# Patient Record
Sex: Female | Born: 1950 | Race: White | Hispanic: Yes | Smoking: Former smoker
Health system: Southern US, Community
[De-identification: ages and names within clinical notes are randomized; demographics above are authoritative.]

## PROBLEM LIST (undated history)

## (undated) DIAGNOSIS — J189 Pneumonia, unspecified organism: Secondary | ICD-10-CM

## (undated) DIAGNOSIS — F329 Major depressive disorder, single episode, unspecified: Secondary | ICD-10-CM

## (undated) DIAGNOSIS — M1990 Unspecified osteoarthritis, unspecified site: Secondary | ICD-10-CM

## (undated) DIAGNOSIS — F419 Anxiety disorder, unspecified: Secondary | ICD-10-CM

## (undated) DIAGNOSIS — F32A Depression, unspecified: Secondary | ICD-10-CM

## (undated) DIAGNOSIS — R51 Headache: Secondary | ICD-10-CM

## (undated) HISTORY — PX: CERVICAL FUSION: SHX112

---

## 2011-01-20 ENCOUNTER — Encounter (HOSPITAL_COMMUNITY)
Admission: RE | Admit: 2011-01-20 | Discharge: 2011-01-20 | Disposition: A | Discharge status: Home / Self Care | Payer: Medicare Other | Source: Ambulatory Visit | Attending: Neurosurgery | Admitting: Neurosurgery

## 2011-01-20 LAB — CBC
HCT: 37 % (ref 36.0–46.0)
Hemoglobin: 12.5 g/dL (ref 12.0–15.0)
MCH: 28.7 pg (ref 26.0–34.0)
MCHC: 33.8 g/dL (ref 30.0–36.0)
MCV: 85.1 fL (ref 78.0–100.0)
Platelets: 311 10*3/uL (ref 150–400)
RBC: 4.35 MIL/uL (ref 3.87–5.11)
RDW: 13.4 % (ref 11.5–15.5)
WBC: 8.8 10*3/uL (ref 4.0–10.5)

## 2011-01-20 LAB — COMPREHENSIVE METABOLIC PANEL
ALT: 24 U/L (ref 0–35)
AST: 27 U/L (ref 0–37)
Albumin: 3.5 g/dL (ref 3.5–5.2)
Alkaline Phosphatase: 76 U/L (ref 39–117)
BUN: 11 mg/dL (ref 6–23)
CO2: 28 mEq/L (ref 19–32)
Calcium: 9.2 mg/dL (ref 8.4–10.5)
Chloride: 99 mEq/L (ref 96–112)
Creatinine, Ser: 0.55 mg/dL (ref 0.50–1.10)
GFR calc Af Amer: >60 mL/min (ref >60)
GFR calc non Af Amer: >60 mL/min (ref >60)
Glucose, Bld: 86 mg/dL (ref 70–99)
Potassium: 4.2 mEq/L (ref 3.5–5.1)
Sodium: 137 mEq/L (ref 135–145)
Total Bilirubin: 0.2 mg/dL — ABNORMAL LOW (ref 0.3–1.2)
Total Protein: 7.6 g/dL (ref 6.0–8.3)

## 2011-01-20 LAB — SURGICAL PCR SCREEN
MRSA, PCR: NEGATIVE
Staphylococcus aureus: NEGATIVE

## 2011-01-24 ENCOUNTER — Inpatient Hospital Stay (HOSPITAL_COMMUNITY): Payer: Medicare Other

## 2011-01-24 ENCOUNTER — Inpatient Hospital Stay (HOSPITAL_COMMUNITY)
Admission: RE | Admit: 2011-01-24 | Discharge: 2011-01-26 | DRG: 473 | Disposition: A | Discharge status: Home / Self Care | Payer: Medicare Other | Source: Ambulatory Visit | Attending: Neurosurgery | Admitting: Neurosurgery

## 2011-01-24 DIAGNOSIS — R1310 Dysphagia, unspecified: Secondary | ICD-10-CM | POA: Diagnosis present

## 2011-01-24 DIAGNOSIS — F17200 Nicotine dependence, unspecified, uncomplicated: Secondary | ICD-10-CM | POA: Diagnosis present

## 2011-01-24 DIAGNOSIS — Z01812 Encounter for preprocedural laboratory examination: Secondary | ICD-10-CM

## 2011-01-24 DIAGNOSIS — M5000 Cervical disc disorder with myelopathy, unspecified cervical region: Principal | ICD-10-CM | POA: Diagnosis present

## 2011-01-31 NOTE — Op Note (Signed)
Tina [name], Tina [name]            ACCOUNT NO.:  [id number]  MEDICAL RECORD NO.:  [id number]  LOCATION:  3535                         FACILITY:  [facility]  PHYSICIAN:  Hewitt Shorts, M.D.DATE OF BIRTH:  [birth year]  DATE OF PROCEDURE:  01/24/2011 DATE OF DISCHARGE:                              OPERATIVE REPORT   PREOPERATIVE DIAGNOSES:  This is a cervical disk herniation with myelopathy, cervical spondylosis, with myelopathy, cervical stenosis and hyperreflexia.  POSTOPERATIVE DIAGNOSES:  This is a cervical disk herniation with myelopathy, cervical spondylosis with myelopathy, cervical stenosis and hyperreflexia.  PROCEDURE:  C3-4 and C4-5 anterior cervical decompression and arthrodesis with allograft and tether cervical plating.  SURGEON:  Hewitt Shorts, MD.  ASSISTANT:  Clydene Fake, MD.  ANESTHESIA:  General endotracheal.  INDICATIONS:  The patient is a 60 year old woman, who presented with progressive myelopathy.  She had had a fall in October 2011.  She had marked stenosis at the C3-4 level, moderate stenosis at C4-5 level, increased signal in the spinal cord from C3 to C4, moderate degenerative changes with left stenosis at C5-6 and C6-7.  A decision was made to proceed with decompression and stabilization at the C3-4 and C4-5 levels.  DESCRIPTION OF PROCEDURE:  The patient was brought to the operating room, placed on general endotracheal anesthesia.  The patient was placed in 10 pounds of Holter traction.  Neck was prepped with Betadine soap solution and draped in sterile fashion.  Horizontal incision was made on the left side of the neck.  The line of incision was infiltrated with local anesthetic with epinephrine.  Decision made, carried down through subcutaneous tissue and platysma.  Bipolar cautery was cautery used to maintain hemostasis.  Dissection was carried out through the avascular plane, leaving the sternocleidomastoid, carotid artery and  jugular vein laterally, and trachea and esophagus medially.  The ventral aspect of the tubal column identified and localized x-rays were taken, the C3-4, C4-5 intervertebral disk spaces were identified, visualized osteophyte anteriorly at C3-4.  This was removed and we entered into the disk space at both the C3-4 and C4-5 levels.  Diskectomy was performed using variety of micro curettes, pituitary rongeurs, with significant spondylotic degeneration at each level, worse at the C3-4 level.  The cartilaginous endplates were removed using micro curettes along the high- speed drill.  The operating microscope was draped and brought into the field to provide additional navigation, illumination, and visualization. The remainder of the dissection and decompression was performed using microdissection and microsurgical technique.  Posterior osteophytic overgrowth at each level was removed using the high-speed drill along with 2-mm Kerrison punch with thin footplate and then we carefully removed the spondylitic disk herniation in the posterior longitudinal ligament, decompressing the spinal canal and thecal sac at each level. We then continued our decompression laterally into the neural foramen, decompressing the neural foramen for the exiting C4 and C5 nerve roots.  Once decompression was complete, hemostasis established with use of Gelfoam soaked in thrombin.  We measured the height of the intervertebral disk space at each level and selected two 6-mm allograft implants, they were hydrated in saline solution and positioned in the intervertebral space and countersunk.  We then selected a  29-mm tether cervical plate was positioned over the fusion construct and secured to the vertebra with 4 x 13 mm screws placing a pair of variable screws at C3, a single variable screw at C4, and a pair of fixed screws at C5.  Each screw was drilled and the screws placed in the alternating fashion.  Once all five  screws were placed, the final tightening was performed.  An x-ray showed the graft, plate, and screws were all in good position.  The alignment was good.  Then, the wound was irrigated with Bacitracin solution, checked for hemostasis, which was established and confirmed, and then we proceed with closure.  The platysma was closed with interrupted inverted 2-0 Vicryl sutures.  Subcutaneous and subcuticular were closed with interrupted inverted 3-0 Vicryl sutures.  Skin was closed with Dermabond.  Procedure was tolerated well.  Estimated blood loss was 25 mL.  Sponge count correct.  Following surgery, the patient was placed in a soft cervical collar, reversed from the anesthetic, extubated, and transferred to the recovery room for further care.     Hewitt Shorts, M.D.     RWN/MEDQ  D:  01/24/2011  T:  01/25/2011  Job:  [id number]  Electronically Signed by Shirlean Kelly M.D. on 01/31/2011 09:47:42 AM

## 2011-09-01 ENCOUNTER — Encounter (HOSPITAL_COMMUNITY): Payer: Self-pay | Admitting: Pharmacy Technician

## 2011-09-05 ENCOUNTER — Inpatient Hospital Stay (HOSPITAL_COMMUNITY): Admission: RE | Admit: 2011-09-05 | Payer: Medicare Other | Source: Ambulatory Visit

## 2011-09-07 ENCOUNTER — Encounter (HOSPITAL_COMMUNITY): Payer: Self-pay

## 2011-09-07 ENCOUNTER — Encounter (HOSPITAL_COMMUNITY)
Admission: RE | Admit: 2011-09-07 | Discharge: 2011-09-07 | Disposition: A | Discharge status: Home / Self Care | Payer: Medicare Other | Source: Ambulatory Visit | Attending: Orthopedic Surgery | Admitting: Orthopedic Surgery

## 2011-09-07 HISTORY — DX: Depression, unspecified: F32.A

## 2011-09-07 HISTORY — DX: Anxiety disorder, unspecified: F41.9

## 2011-09-07 HISTORY — DX: Headache: R51

## 2011-09-07 HISTORY — DX: Unspecified osteoarthritis, unspecified site: M19.90

## 2011-09-07 HISTORY — DX: Major depressive disorder, single episode, unspecified: F32.9

## 2011-09-07 LAB — BASIC METABOLIC PANEL
CO2: 27 mEq/L (ref 19–32)
Calcium: 9.9 mg/dL (ref 8.4–10.5)
Chloride: 101 mEq/L (ref 96–112)
Sodium: 137 mEq/L (ref 135–145)

## 2011-09-07 LAB — URINALYSIS, ROUTINE W REFLEX MICROSCOPIC
Hgb urine dipstick: NEGATIVE
Nitrite: NEGATIVE
Specific Gravity, Urine: 1.025 (ref 1.005–1.030)
Urobilinogen, UA: 0.2 mg/dL (ref 0.0–1.0)

## 2011-09-07 LAB — DIFFERENTIAL
Eosinophils Relative: 11 % — ABNORMAL HIGH (ref 0–5)
Lymphocytes Relative: 31 % (ref 12–46)
Lymphs Abs: 3.1 10*3/uL (ref 0.7–4.0)
Neutro Abs: 5.3 10*3/uL (ref 1.7–7.7)

## 2011-09-07 LAB — CBC
HCT: 40.8 % (ref 36.0–46.0)
MCV: 88.3 fL (ref 78.0–100.0)
Platelets: 322 10*3/uL (ref 150–400)
RBC: 4.62 MIL/uL (ref 3.87–5.11)
WBC: 10.1 10*3/uL (ref 4.0–10.5)

## 2011-09-07 LAB — SURGICAL PCR SCREEN
MRSA, PCR: NEGATIVE
Staphylococcus aureus: NEGATIVE

## 2011-09-07 LAB — APTT: aPTT: 43 seconds — ABNORMAL HIGH (ref 24–37)

## 2011-09-07 LAB — URINE MICROSCOPIC-ADD ON

## 2011-09-07 LAB — PROTIME-INR: Prothrombin Time: 13.3 seconds (ref 11.6–15.2)

## 2011-09-07 NOTE — Patient Instructions (Signed)
20 NATURE KUEKER  09/07/2011   Your procedure is scheduled on: 09/13/11 0835am-0940am  Report to [facility] at 0600 AM.  Call this number if you have problems the morning of surgery: [phone]   Remember:   Do not eat food:After Midnight.    Take these medicines the morning of surgery with A SIP OF WATER:    Do not wear jewelry, make-up or nail polish.  Do not wear lotions, powders, or perfumes..  Do not shave 48 hours prior to surgery.  Do not bring valuables to the hospital.  Contacts, dentures or bridgework may not be worn into surgery.  Leave suitcase in the car. After surgery it may be brought to your room.  For patients admitted to the hospital, checkout time is 11:00 AM the day of discharge.      Special Instructions: CHG Shower Use Special Wash: 1/2 bottle night before surgery and 1/2 bottle morning of surgery. shower chin to toes with CHG.  Wash face adn private parts with regular soap.    Please read over the following fact sheets that you were given: MRSA Informationm coughing and deep breathing exercises, leg exercises, Blood Transfusion Fact Sheet, Incentive Spirometry Fact Sheet

## 2011-09-07 NOTE — Pre-Procedure Instructions (Signed)
09/07/11 Left message on voice mail of Tina [name] abnormal Urinalysis result along with PTT result of 43.

## 2011-09-07 NOTE — Progress Notes (Signed)
H&P performed 09/07/2011 Dictation # [id number]

## 2011-09-08 NOTE — H&P (Signed)
Tina, [name]            ACCOUNT NO.:  [id number]  MEDICAL RECORD NO.:  [id number]  LOCATION:  PADM                         FACILITY:  [facility]  PHYSICIAN:  Madlyn Frankel. Charlann Boxer, M.D.  DATE OF BIRTH:  1951-07-10  DATE OF ADMISSION:  09/07/2011 DATE OF DISCHARGE:  09/07/2011                             HISTORY & PHYSICAL   ANTICIPATED DATE OF SURGERY:  September 13, 2011.  ADMITTING DIAGNOSIS:  Osteoarthritis, right hip.  HISTORY OF PRESENT ILLNESS:  This is a 61 year old lady with a history of osteoarthritis of right hip that has failed conservative management. After discussion of treatments, benefits, risks, and options, the patient is now scheduled for total hip arthroplasty of the right hip by anterior approach.  Note that, the patient is a candidate for tranexamic acid  and dexamethasone, which she will receive both at surgery.  Her medical doctor is Dr. Marylouise [name]  at [facility].  She does plan on going home after surgery and she is given her home medications of iron, Robaxin, MiraLax, Colace, and aspirin.  DRUG ALLERGIES:  None.  CURRENT MEDICATIONS:  Zoloft, aspirin, Klonopin, Cymbalta, and trazodone.  She did not bring her dosages with her.  She will bring those to the hospital.  PREVIOUS SURGERIES:  Anterior cervical fusion.  SERIOUS MEDICAL ILLNESSES:  Anxiety, depression, vertigo, and arthritis.  FAMILY HISTORY:  Positive for diabetes.  SOCIAL HISTORY:  The patient is divorced.  She is unemployed, on disability.  She smokes about a half a pack a day and drinks occasionally.  REVIEW OF SYSTEMS:  CENTRAL NERVOUS SYSTEM:  Positive for vertigo and anxiety.  PULMONARY:  Negative for shortness of breath, PND, and orthopnea.  CARDIOVASCULAR:  Negative for chest pain, palpitation.  GI: Negative for ulcers, hepatitis.  GU:  Negative for urinary tract difficulty.  MUSCULOSKELETAL:  Positive in the HPI.  PHYSICAL EXAMINATION:  VITAL SIGNS:  Blood  pressure 115/75, pulse 78 and regular, respirations 14. HEENT:  Head, normocephalic.  Nose patent.  Ears patent.  Pupils equal, round, reactive to light.  Throat without injection. NECK:  Supple without adenopathy.  Carotids 2+ without bruits. CHEST:  Clear to auscultation.  No rales or rhonchi.  Respirations 14. HEART:  Regular rate and rhythm at 78 beats per minute without murmur. ABDOMEN:  Soft with active bowel sounds.  No masses or organomegaly. NEUROLOGIC:  The patient is alert, oriented to time, place, and person. Cranial nerves II through XII grossly intact. EXTREMITIES:  Right hip with decreased range of motion with pain. Sensation and circulation are intact.  Dorsalis pedis and posterior tibialis pulses are 2+.  IMPRESSION:  Osteoarthritis, right hip.  PLAN:  Total hip arthroplasty, right hip, by anterior approach.     Jaquelyn Bitter. Laraine Samet, P.A.   ______________________________ Madlyn Frankel Charlann Boxer, M.D.    SJC/MEDQ  D:  09/07/2011  T:  09/08/2011  Job:  [id number]

## 2011-09-08 NOTE — H&P (Signed)
Tina [name], PROVENCIO            ACCOUNT NO.:  [id number]  MEDICAL RECORD NO.:  [id number]  LOCATION:  PADM                         FACILITY:  [facility]  PHYSICIAN:  Jaquelyn Bitter. Ellenor Wisniewski, P.A.DATE OF BIRTH:  1951-06-24  DATE OF ADMISSION:  09/07/2011 DATE OF DISCHARGE:  09/07/2011                             HISTORY & PHYSICAL   ADDENDUM:  Also note that she is given her home medicines of aspirin, Robaxin, iron, MiraLax, and Colace to take postoperatively.     Jaquelyn Bitter. Ernestene Kiel.     SJC/MEDQ  D:  09/07/2011  T:  09/08/2011  Job:  [id number]

## 2011-09-13 ENCOUNTER — Inpatient Hospital Stay (HOSPITAL_COMMUNITY): Payer: Medicare Other

## 2011-09-13 ENCOUNTER — Encounter (HOSPITAL_COMMUNITY): Payer: Self-pay | Admitting: Anesthesiology

## 2011-09-13 ENCOUNTER — Encounter (HOSPITAL_COMMUNITY): Payer: Self-pay | Admitting: *Deleted

## 2011-09-13 ENCOUNTER — Encounter (HOSPITAL_COMMUNITY)
Admission: RE | Disposition: A | Discharge status: Skilled Nursing Facility | Payer: Self-pay | Source: Ambulatory Visit | Attending: Orthopedic Surgery

## 2011-09-13 ENCOUNTER — Inpatient Hospital Stay (HOSPITAL_COMMUNITY)
Admission: RE | Admit: 2011-09-13 | Discharge: 2011-09-16 | DRG: 470 | Disposition: A | Discharge status: Skilled Nursing Facility | Payer: Medicare Other | Source: Ambulatory Visit | Attending: Orthopedic Surgery | Admitting: Orthopedic Surgery

## 2011-09-13 ENCOUNTER — Inpatient Hospital Stay (HOSPITAL_COMMUNITY): Payer: Medicare Other | Admitting: Anesthesiology

## 2011-09-13 DIAGNOSIS — Z01812 Encounter for preprocedural laboratory examination: Secondary | ICD-10-CM

## 2011-09-13 DIAGNOSIS — Z981 Arthrodesis status: Secondary | ICD-10-CM

## 2011-09-13 DIAGNOSIS — D649 Anemia, unspecified: Secondary | ICD-10-CM | POA: Diagnosis not present

## 2011-09-13 DIAGNOSIS — F329 Major depressive disorder, single episode, unspecified: Secondary | ICD-10-CM | POA: Diagnosis present

## 2011-09-13 DIAGNOSIS — Z96649 Presence of unspecified artificial hip joint: Secondary | ICD-10-CM

## 2011-09-13 DIAGNOSIS — M169 Osteoarthritis of hip, unspecified: Principal | ICD-10-CM | POA: Diagnosis present

## 2011-09-13 DIAGNOSIS — M1610 Unilateral primary osteoarthritis, unspecified hip: Principal | ICD-10-CM | POA: Diagnosis present

## 2011-09-13 DIAGNOSIS — R42 Dizziness and giddiness: Secondary | ICD-10-CM | POA: Diagnosis not present

## 2011-09-13 DIAGNOSIS — F411 Generalized anxiety disorder: Secondary | ICD-10-CM | POA: Diagnosis present

## 2011-09-13 DIAGNOSIS — F17200 Nicotine dependence, unspecified, uncomplicated: Secondary | ICD-10-CM | POA: Diagnosis present

## 2011-09-13 DIAGNOSIS — F3289 Other specified depressive episodes: Secondary | ICD-10-CM | POA: Diagnosis present

## 2011-09-13 HISTORY — PX: TOTAL HIP ARTHROPLASTY: SHX124

## 2011-09-13 LAB — TYPE AND SCREEN: Antibody Screen: NEGATIVE

## 2011-09-13 LAB — ABO/RH: ABO/RH(D): A POS

## 2011-09-13 SURGERY — ARTHROPLASTY, HIP, TOTAL, ANTERIOR APPROACH
Anesthesia: General | Site: Hip | Laterality: Right | Wound class: Clean

## 2011-09-13 MED ORDER — METHOCARBAMOL 500 MG PO TABS
500.0000 mg | ORAL_TABLET | Freq: Four times a day (QID) | ORAL | Status: DC | PRN
  Administered 2011-09-14 – 2011-09-16 (×4): 500 mg via ORAL
  Filled 2011-09-13 (×4): qty 1

## 2011-09-13 MED ORDER — ACETAMINOPHEN 650 MG RE SUPP: 650.0000 mg | Freq: Four times a day (QID) | RECTAL | Status: DC | PRN

## 2011-09-13 MED ORDER — PHENOL 1.4 % MT LIQD
1.0000 | OROMUCOSAL | Status: DC | PRN
  Filled 2011-09-13: qty 177

## 2011-09-13 MED ORDER — MIDAZOLAM HCL 5 MG/5ML IJ SOLN
INTRAMUSCULAR | Status: DC | PRN
  Administered 2011-09-13: 2 mg via INTRAVENOUS

## 2011-09-13 MED ORDER — FERROUS SULFATE 325 (65 FE) MG PO TABS
325.0000 mg | ORAL_TABLET | Freq: Three times a day (TID) | ORAL | Status: DC
  Administered 2011-09-13 – 2011-09-16 (×8): 325 mg via ORAL
  Filled 2011-09-13 (×6): qty 1

## 2011-09-13 MED ORDER — METHOCARBAMOL 100 MG/ML IJ SOLN
500.0000 mg | Freq: Four times a day (QID) | INTRAVENOUS | Status: DC | PRN
  Administered 2011-09-13: 500 mg via INTRAVENOUS
  Filled 2011-09-13 (×2): qty 5

## 2011-09-13 MED ORDER — MENTHOL 3 MG MT LOZG
1.0000 | LOZENGE | OROMUCOSAL | Status: DC | PRN
  Filled 2011-09-13: qty 9

## 2011-09-13 MED ORDER — HYDROCODONE-ACETAMINOPHEN 7.5-325 MG PO TABS
1.0000 | ORAL_TABLET | ORAL | Status: DC
  Administered 2011-09-13: 2 via ORAL
  Administered 2011-09-13 – 2011-09-14 (×2): 1 via ORAL
  Administered 2011-09-14 (×2): 2 via ORAL
  Administered 2011-09-14: 1 via ORAL
  Administered 2011-09-14 – 2011-09-16 (×9): 2 via ORAL
  Filled 2011-09-13 (×11): qty 2
  Filled 2011-09-13: qty 1
  Filled 2011-09-13 (×3): qty 2

## 2011-09-13 MED ORDER — CEFAZOLIN SODIUM 1-5 GM-% IV SOLN
1.0000 g | Freq: Four times a day (QID) | INTRAVENOUS | Status: AC
  Administered 2011-09-13 – 2011-09-14 (×3): 1 g via INTRAVENOUS
  Filled 2011-09-13 (×3): qty 50

## 2011-09-13 MED ORDER — POLYETHYLENE GLYCOL 3350 17 G PO PACK
17.0000 g | PACK | Freq: Two times a day (BID) | ORAL | Status: DC
  Administered 2011-09-14 – 2011-09-16 (×4): 17 g via ORAL
  Filled 2011-09-13 (×5): qty 1

## 2011-09-13 MED ORDER — ONDANSETRON HCL 4 MG/2ML IJ SOLN
INTRAMUSCULAR | Status: DC | PRN
  Administered 2011-09-13: 4 mg via INTRAVENOUS

## 2011-09-13 MED ORDER — HYDROMORPHONE HCL PF 1 MG/ML IJ SOLN
0.5000 mg | INTRAMUSCULAR | Status: DC | PRN
  Administered 2011-09-14 (×2): 1 mg via INTRAVENOUS
  Filled 2011-09-13 (×2): qty 1

## 2011-09-13 MED ORDER — CEFAZOLIN SODIUM 1-5 GM-% IV SOLN
1.0000 g | INTRAVENOUS | Status: AC
  Administered 2011-09-13: 1 g via INTRAVENOUS

## 2011-09-13 MED ORDER — FENTANYL CITRATE 0.05 MG/ML IJ SOLN
INTRAMUSCULAR | Status: DC | PRN
  Administered 2011-09-13: 50 ug via INTRAVENOUS
  Administered 2011-09-13: 100 ug via INTRAVENOUS
  Administered 2011-09-13: 25 ug via INTRAVENOUS
  Administered 2011-09-13: 50 ug via INTRAVENOUS
  Administered 2011-09-13 (×2): 25 ug via INTRAVENOUS
  Administered 2011-09-13: 50 ug via INTRAVENOUS
  Administered 2011-09-13 (×2): 25 ug via INTRAVENOUS
  Administered 2011-09-13: 50 ug via INTRAVENOUS
  Administered 2011-09-13: 25 ug via INTRAVENOUS
  Administered 2011-09-13: 50 ug via INTRAVENOUS

## 2011-09-13 MED ORDER — DEXAMETHASONE SODIUM PHOSPHATE 10 MG/ML IJ SOLN
INTRAMUSCULAR | Status: DC | PRN
  Administered 2011-09-13: 10 mg via INTRAVENOUS

## 2011-09-13 MED ORDER — ACETAMINOPHEN 325 MG PO TABS: 650.0000 mg | ORAL_TABLET | Freq: Four times a day (QID) | ORAL | Status: DC | PRN

## 2011-09-13 MED ORDER — METOCLOPRAMIDE HCL 5 MG/ML IJ SOLN: 5.0000 mg | Freq: Three times a day (TID) | INTRAMUSCULAR | Status: DC | PRN

## 2011-09-13 MED ORDER — 0.9 % SODIUM CHLORIDE (POUR BTL) OPTIME
TOPICAL | Status: DC | PRN
  Administered 2011-09-13: 1000 mL

## 2011-09-13 MED ORDER — ACETAMINOPHEN 10 MG/ML IV SOLN
INTRAVENOUS | Status: DC | PRN
  Administered 2011-09-13: 1000 mg via INTRAVENOUS

## 2011-09-13 MED ORDER — ONDANSETRON HCL 4 MG/2ML IJ SOLN: 4.0000 mg | Freq: Four times a day (QID) | INTRAMUSCULAR | Status: DC | PRN

## 2011-09-13 MED ORDER — HYDROMORPHONE HCL PF 1 MG/ML IJ SOLN
INTRAMUSCULAR | Status: AC
  Filled 2011-09-13: qty 1

## 2011-09-13 MED ORDER — NEOSTIGMINE METHYLSULFATE 1 MG/ML IJ SOLN
INTRAMUSCULAR | Status: DC | PRN
  Administered 2011-09-13: 4 mg via INTRAVENOUS

## 2011-09-13 MED ORDER — LIDOCAINE HCL (CARDIAC) 20 MG/ML IV SOLN
INTRAVENOUS | Status: DC | PRN
  Administered 2011-09-13: 60 mg via INTRAVENOUS

## 2011-09-13 MED ORDER — DEXAMETHASONE SODIUM PHOSPHATE 10 MG/ML IJ SOLN
10.0000 mg | Freq: Once | INTRAMUSCULAR | Status: AC
  Administered 2011-09-14: 10 mg via INTRAVENOUS
  Filled 2011-09-13: qty 1

## 2011-09-13 MED ORDER — HYDRALAZINE HCL 20 MG/ML IJ SOLN
INTRAMUSCULAR | Status: DC | PRN
  Administered 2011-09-13 (×2): 2 mg via INTRAVENOUS

## 2011-09-13 MED ORDER — KETAMINE HCL 10 MG/ML IJ SOLN
INTRAMUSCULAR | Status: DC | PRN
  Administered 2011-09-13: 5 mg via INTRAVENOUS
  Administered 2011-09-13 (×2): 10 mg via INTRAVENOUS

## 2011-09-13 MED ORDER — GLYCOPYRROLATE 0.2 MG/ML IJ SOLN
INTRAMUSCULAR | Status: DC | PRN
  Administered 2011-09-13: .6 mg via INTRAVENOUS

## 2011-09-13 MED ORDER — ALUM & MAG HYDROXIDE-SIMETH 200-200-20 MG/5ML PO SUSP: 30.0000 mL | ORAL | Status: DC | PRN

## 2011-09-13 MED ORDER — PROPOFOL 10 MG/ML IV EMUL
INTRAVENOUS | Status: DC | PRN
  Administered 2011-09-13: 150 mg via INTRAVENOUS

## 2011-09-13 MED ORDER — FLEET ENEMA 7-19 GM/118ML RE ENEM: 1.0000 | ENEMA | Freq: Once | RECTAL | Status: AC | PRN

## 2011-09-13 MED ORDER — RIVAROXABAN 10 MG PO TABS
10.0000 mg | ORAL_TABLET | ORAL | Status: DC
  Administered 2011-09-14 – 2011-09-16 (×3): 10 mg via ORAL
  Filled 2011-09-13 (×3): qty 1

## 2011-09-13 MED ORDER — CLONAZEPAM 1 MG PO TABS
1.0000 mg | ORAL_TABLET | Freq: Three times a day (TID) | ORAL | Status: DC
  Administered 2011-09-13 – 2011-09-16 (×8): 1 mg via ORAL
  Filled 2011-09-13 (×8): qty 1

## 2011-09-13 MED ORDER — HETASTARCH-ELECTROLYTES 6 % IV SOLN
INTRAVENOUS | Status: DC | PRN
  Administered 2011-09-13: 16:00:00 via INTRAVENOUS

## 2011-09-13 MED ORDER — DIPHENHYDRAMINE HCL 25 MG PO CAPS: 25.0000 mg | ORAL_CAPSULE | Freq: Four times a day (QID) | ORAL | Status: DC | PRN

## 2011-09-13 MED ORDER — TRAZODONE HCL 50 MG PO TABS
50.0000 mg | ORAL_TABLET | Freq: Every day | ORAL | Status: DC
  Administered 2011-09-13 – 2011-09-15 (×3): 100 mg via ORAL
  Filled 2011-09-13 (×4): qty 2

## 2011-09-13 MED ORDER — SERTRALINE HCL 100 MG PO TABS
200.0000 mg | ORAL_TABLET | Freq: Every day | ORAL | Status: DC
  Administered 2011-09-14 – 2011-09-16 (×3): 200 mg via ORAL
  Filled 2011-09-13 (×3): qty 2

## 2011-09-13 MED ORDER — DOCUSATE SODIUM 100 MG PO CAPS
100.0000 mg | ORAL_CAPSULE | Freq: Two times a day (BID) | ORAL | Status: DC
  Administered 2011-09-13 – 2011-09-16 (×5): 100 mg via ORAL
  Filled 2011-09-13 (×4): qty 1

## 2011-09-13 MED ORDER — SODIUM CHLORIDE 0.9 % IV SOLN
100.0000 mL/h | INTRAVENOUS | Status: DC
  Administered 2011-09-13 – 2011-09-14 (×3): 100 mL/h via INTRAVENOUS
  Filled 2011-09-13 (×11): qty 1000

## 2011-09-13 MED ORDER — LACTATED RINGERS IV SOLN
INTRAVENOUS | Status: DC
  Administered 2011-09-13 (×2): via INTRAVENOUS
  Administered 2011-09-13: 1000 mL via INTRAVENOUS

## 2011-09-13 MED ORDER — HYDROMORPHONE HCL PF 1 MG/ML IJ SOLN
INTRAMUSCULAR | Status: AC
  Administered 2011-09-13 (×2): 0.5 mg via INTRAVENOUS
  Filled 2011-09-13: qty 1

## 2011-09-13 MED ORDER — BISACODYL 5 MG PO TBEC: 5.0000 mg | DELAYED_RELEASE_TABLET | Freq: Every day | ORAL | Status: DC | PRN

## 2011-09-13 MED ORDER — HYDROMORPHONE HCL PF 1 MG/ML IJ SOLN
0.2500 mg | INTRAMUSCULAR | Status: DC | PRN
  Administered 2011-09-13 (×3): 0.5 mg via INTRAVENOUS

## 2011-09-13 MED ORDER — ONDANSETRON HCL 4 MG PO TABS: 4.0000 mg | ORAL_TABLET | Freq: Four times a day (QID) | ORAL | Status: DC | PRN

## 2011-09-13 MED ORDER — EPHEDRINE SULFATE 50 MG/ML IJ SOLN
INTRAMUSCULAR | Status: DC | PRN
  Administered 2011-09-13 (×2): 5 mg via INTRAVENOUS

## 2011-09-13 MED ORDER — DEXAMETHASONE SODIUM PHOSPHATE 10 MG/ML IJ SOLN
10.0000 mg | Freq: Once | INTRAMUSCULAR | Status: DC
  Filled 2011-09-13: qty 1

## 2011-09-13 MED ORDER — DULOXETINE HCL 60 MG PO CPEP
120.0000 mg | ORAL_CAPSULE | Freq: Every day | ORAL | Status: DC
  Administered 2011-09-13 – 2011-09-16 (×4): 120 mg via ORAL
  Filled 2011-09-13 (×4): qty 2

## 2011-09-13 MED ORDER — LACTATED RINGERS IV SOLN
INTRAVENOUS | Status: DC
Start: 2011-09-13 — End: 2011-09-13

## 2011-09-13 MED ORDER — METOCLOPRAMIDE HCL 10 MG PO TABS: 5.0000 mg | ORAL_TABLET | Freq: Three times a day (TID) | ORAL | Status: DC | PRN

## 2011-09-13 MED ORDER — ZOLPIDEM TARTRATE 5 MG PO TABS: 5.0000 mg | ORAL_TABLET | Freq: Every evening | ORAL | Status: DC | PRN

## 2011-09-13 MED ORDER — ROCURONIUM BROMIDE 100 MG/10ML IV SOLN
INTRAVENOUS | Status: DC | PRN
  Administered 2011-09-13: 40 mg via INTRAVENOUS
  Administered 2011-09-13 (×2): 10 mg via INTRAVENOUS

## 2011-09-13 MED ORDER — SODIUM CHLORIDE 0.9 % IV SOLN
1120.0000 mg | Freq: Once | INTRAVENOUS | Status: AC
  Administered 2011-09-13: 1120 mg via INTRAVENOUS
  Filled 2011-09-13: qty 11.2

## 2011-09-13 MED ORDER — ALBUTEROL SULFATE HFA 108 (90 BASE) MCG/ACT IN AERS
INHALATION_SPRAY | RESPIRATORY_TRACT | Status: DC | PRN
  Administered 2011-09-13: 5 via RESPIRATORY_TRACT

## 2011-09-13 SURGICAL SUPPLY — 39 items
BAG ZIPLOCK 12X15 (MISCELLANEOUS) ×2 IMPLANT
BLADE SAW SGTL 18X1.27X75 (BLADE) ×2 IMPLANT
CELLS DAT CNTRL 66122 CELL SVR (MISCELLANEOUS) IMPLANT
CLOTH BEACON ORANGE TIMEOUT ST (SAFETY) ×2 IMPLANT
DERMABOND ADVANCED (GAUZE/BANDAGES/DRESSINGS) ×1
DERMABOND ADVANCED .7 DNX12 (GAUZE/BANDAGES/DRESSINGS) ×1 IMPLANT
DRAPE C-ARM 42X72 X-RAY (DRAPES) ×2 IMPLANT
DRAPE STERI IOBAN 125X83 (DRAPES) ×2 IMPLANT
DRAPE U-SHAPE 47X51 STRL (DRAPES) ×6 IMPLANT
DRSG AQUACEL AG ADV 3.5X10 (GAUZE/BANDAGES/DRESSINGS) ×2 IMPLANT
DRSG TEGADERM 4X4.75 (GAUZE/BANDAGES/DRESSINGS) ×2 IMPLANT
DURAPREP 26ML APPLICATOR (WOUND CARE) ×2 IMPLANT
ELECT BLADE TIP CTD 4 INCH (ELECTRODE) ×2 IMPLANT
ELECT REM PT RETURN 9FT ADLT (ELECTROSURGICAL) ×2
ELECTRODE REM PT RTRN 9FT ADLT (ELECTROSURGICAL) ×1 IMPLANT
EVACUATOR 1/8 PVC DRAIN (DRAIN) ×2 IMPLANT
FACESHIELD LNG OPTICON STERILE (SAFETY) ×8 IMPLANT
GAUZE SPONGE 2X2 8PLY STRL LF (GAUZE/BANDAGES/DRESSINGS) ×1 IMPLANT
GLOVE BIOGEL PI IND STRL 7.5 (GLOVE) ×1 IMPLANT
GLOVE BIOGEL PI IND STRL 8 (GLOVE) ×1 IMPLANT
GLOVE BIOGEL PI INDICATOR 7.5 (GLOVE) ×1
GLOVE BIOGEL PI INDICATOR 8 (GLOVE) ×1
GLOVE ECLIPSE 8.0 STRL XLNG CF (GLOVE) ×2 IMPLANT
GLOVE ORTHO TXT STRL SZ7.5 (GLOVE) ×4 IMPLANT
GOWN BRE IMP PREV XXLGXLNG (GOWN DISPOSABLE) ×2 IMPLANT
GOWN STRL NON-REIN LRG LVL3 (GOWN DISPOSABLE) ×2 IMPLANT
KIT BASIN OR (CUSTOM PROCEDURE TRAY) ×2 IMPLANT
PACK TOTAL JOINT (CUSTOM PROCEDURE TRAY) ×2 IMPLANT
PADDING CAST COTTON 6X4 STRL (CAST SUPPLIES) ×2 IMPLANT
RTRCTR WOUND ALEXIS 18CM MED (MISCELLANEOUS)
SPONGE GAUZE 2X2 STER 10/PKG (GAUZE/BANDAGES/DRESSINGS) ×1
SUCTION FRAZIER 12FR DISP (SUCTIONS) ×2 IMPLANT
SUT MNCRL AB 4-0 PS2 18 (SUTURE) ×2 IMPLANT
SUT VIC AB 1 CT1 36 (SUTURE) ×6 IMPLANT
SUT VIC AB 2-0 CT1 27 (SUTURE) ×2
SUT VIC AB 2-0 CT1 TAPERPNT 27 (SUTURE) ×2 IMPLANT
SUT VLOC 180 0 24IN GS25 (SUTURE) ×2 IMPLANT
TOWEL OR 17X26 10 PK STRL BLUE (TOWEL DISPOSABLE) ×4 IMPLANT
TRAY FOLEY CATH 14FRSI W/METER (CATHETERS) ×2 IMPLANT

## 2011-09-13 NOTE — Anesthesia Postprocedure Evaluation (Signed)
  Anesthesia Post-op Note  Patient: Tina [name]  Procedure(s) Performed: Procedure(s) (LRB): TOTAL HIP ARTHROPLASTY ANTERIOR APPROACH (Right)  Patient Location: PACU  Anesthesia Type: General  Level of Consciousness: awake and alert   Airway and Oxygen Therapy: Patient Spontanous Breathing  Post-op Pain: mild  Post-op Assessment: Post-op Vital signs reviewed, Patient's Cardiovascular Status Stable, Respiratory Function Stable, Patent Airway and No signs of Nausea or vomiting  Post-op Vital Signs: stable  Complications: No apparent anesthesia complications

## 2011-09-13 NOTE — H&P (View-Only) (Signed)
NAME:  [name], Tina            ACCOUNT NO.:  [id number]  MEDICAL RECORD NO.:  [id number]  LOCATION:  PADM                         FACILITY:  [facility]  PHYSICIAN:  Charnay Nazario J. Aissa Lisowski, P.A.DATE OF BIRTH:  06/07/1951  DATE OF ADMISSION:  09/07/2011 DATE OF DISCHARGE:  09/07/2011                             HISTORY & PHYSICAL   ADDENDUM:  Also note that she is given her home medicines of aspirin, Robaxin, iron, MiraLax, and Colace to take postoperatively.     Tearra Ouk J. Senta Kantor, P.A.     SJC/MEDQ  D:  09/07/2011  T:  09/08/2011  Job:  [id number] 

## 2011-09-13 NOTE — Plan of Care (Signed)
Problem: Consults Goal: Diagnosis- Total Joint Replacement Primary Total Hip     

## 2011-09-13 NOTE — Preoperative (Signed)
Beta Blockers   Reason not to administer Beta Blockers:Not Applicable 

## 2011-09-13 NOTE — Op Note (Signed)
NAME:  Tina [name]                ACCOUNT NO.: [id number]      MEDICAL RECORD NO.: [id number]      FACILITY:  [facility]      PHYSICIAN:  Durene Romans D  DATE OF BIRTH:  1950-07-29     DATE OF PROCEDURE:  09/13/2011                                 OPERATIVE REPORT         PREOPERATIVE DIAGNOSIS: Right  hip osteoarthritis.      POSTOPERATIVE DIAGNOSIS:  Right hip osteoarthritis.      PROCEDURE:  Right total hip replacement through an anterior approach   utilizing DePuy THR system, component size 52mm pinnacle cup, a size 36+4 neutral   Altrex liner, a size 7Hi Tri Lock stem with a 36+1.5 delta ceramic   ball.      SURGEON:  Madlyn Frankel. Charlann Boxer, M.D.      ASSISTANT:  Lanney Gins, PA      ANESTHESIA:  General.      SPECIMENS:  None.      COMPLICATIONS:  None.      BLOOD LOSS:  400 cc     DRAINS:  One Hemovac.      INDICATION OF THE PROCEDURE:  Tina [name] is a 61 y.o. female who had   presented to office for evaluation of right hip pain.  Radiographs revealed   progressive degenerative changes with bone-on-bone   articulation to the  hip joint.  The patient had painful limited range of   motion significantly affecting their overall quality of life.  The patient was failing to    respond to conservative measures, and at this point was ready   to proceed with more definitive measures.  The patient has noted progressive   degenerative changes in his hip, progressive problems and dysfunction   with regarding the hip prior to surgery.  Consent was obtained for   benefit of pain relief.  Specific risk of infection, DVT, component   failure, dislocation, need for revision surgery, as well discussion of   the anterior versus posterior approach were reviewed.  Consent was   obtained for benefit of anterior pain relief through an anterior   approach.      PROCEDURE IN DETAIL:  The patient was brought to operative theater.   Once adequate anesthesia, preoperative  antibiotics, 1gm Ancef administered.   The patient was positioned supine on the OSI Hanna table.  Once adequate   padding of boney process was carried out, we had predraped out the hip, and  used fluoroscopy to confirm orientation of the pelvis and position.      The right hip was then prepped and draped from proximal iliac crest to   mid thigh with shower curtain technique.      Time-out was performed identifying the patient, planned procedure, and   extremity.     An incision was then made 2 cm distal and lateral to the   anterior superior iliac spine extending over the orientation of the   tensor fascia lata muscle and sharp dissection was carried down to the   fascia of the muscle and protractor placed in the soft tissues.      The fascia was then incised.  The muscle belly was identified and swept   laterally  and retractor placed along the superior neck.  Following   cauterization of the circumflex vessels and removing some pericapsular   fat, a second cobra retractor was placed on the inferior neck.  A third   retractor was placed on the anterior acetabulum after elevating the   anterior rectus.  A L-capsulotomy was along the line of the   superior neck to the trochanteric fossa, then extended proximally and   distally.  Tag sutures were placed and the retractors were then placed   intracapsular.  We then identified the trochanteric fossa and   orientation of my neck cut, confirmed this radiographically   and then made a neck osteotomy with the femur on traction.  The femoral   head was removed without difficulty or complication.  Traction was let   off and retractors were placed posterior and anterior around the   acetabulum.      The labrum and foveal tissue were debrided.  I began reaming with a 43mm   reamer and reamed up to 51mm reamer with good bony bed preparation and a 52   cup was chosen.  The final 52mm Pinnacle cup was then impacted under fluoroscopy  to confirm the  depth of penetration and orientation with respect to   abduction.  A screw was placed followed by the hole eliminator.  The final   36+4 neutral Altrex liner was impacted with good visualized rim fit.  The cup was positioned anatomically within the acetabular portion of the pelvis.      At this point, the femur was rolled at 80 degrees.  Further capsule was   released off the inferior aspect of the femoral neck.  I then   released the superior capsule proximally.  The hook was placed laterally   along the femur and elevated manually and held in position with the bed   hook.  The leg was then extended and adducted with the leg rolled to 100   degrees of external rotation.  Once the proximal femur was fully   exposed, I used a box osteotome to set orientation.  I then began   broaching with the starting chili pepper broach and passed this by hand and then broached up to 7.  With the 7 broach in place I chose a offset neck, 36+1.5 and did a trial reduction.  The offset was appropriate, leg lengths   appeared to be equal, confirmed radiographically.   Given these findings, I went ahead and dislocated the hip, repositioned all   retractors and positioned the right hip in the extended and abducted position.  The final 7 Hi  Tri Lock stem was   chosen and it was impacted down to the level of neck cut.  Based on this   and the trial reduction, a 36+1.5 delta ceramic ball was chosen and   impacted onto a clean and dry trunnion, and the hip was reduced.  The   hip had been irrigated throughout the case again at this point.  I did   reapproximate the superior capsular leaflet to the anterior leaflet   using #1 Vicryl, placed a medium Hemovac drain deep.  The fascia of the   tensor fascia lata muscle was then reapproximated using #1 Vicryl.  The   remaining wound was closed with 2-0 Vicryl and running 4-0 Monocryl.   The hip was cleaned, dried, and dressed sterilely using Dermabond and   Aquacel  dressing.  Drain site dressed separately.  She was  then brought   to recovery room in stable condition tolerating the procedure well.    Lanney Gins, PA-C was present for the entirety of the case involved from   preoperative positioning, perioperative retractor management, general   facilitation of the case, as well as primary wound closure as assistant.            Madlyn Frankel Charlann Boxer, M.D.            MDO/MEDQ  D:  05/03/2011  T:  05/03/2011  Job:  [id number]      Electronically Signed by Durene Romans M.D. on 05/09/2011 09:15:38 AM

## 2011-09-13 NOTE — Interval H&P Note (Signed)
History and Physical Interval Note:  09/13/2011 10:15 AM  Tina [name]  has presented today for surgery, with the diagnosis of Osteoarthritis of Right Hip  The various methods of treatment have been discussed with the patient and family. After consideration of risks, benefits and other options for treatment, the patient has consented to  Procedure(s) (LRB): RIGHT TOTAL HIP ARTHROPLASTY ANTERIOR APPROACH (Right) as a surgical intervention .  The patients' history has been reviewed, patient examined, no change in status, stable for surgery.  I have reviewed the patients' chart and labs.  Questions were answered to the patient's satisfaction.     Shelda Pal

## 2011-09-13 NOTE — Anesthesia Preprocedure Evaluation (Addendum)
Anesthesia Evaluation  Patient identified by MRN, date of birth, ID band Patient awake    Reviewed: Allergy & Precautions, H&P , NPO status , Patient's Chart, lab work & pertinent test results  Airway Mallampati: II TM Distance: >3 FB Neck ROM: full    Dental  (+) Edentulous Upper and Edentulous Lower   Pulmonary neg pulmonary ROS, Current Smoker,  breath sounds clear to auscultation  Pulmonary exam normal       Cardiovascular Exercise Tolerance: Good negative cardio ROS  Rhythm:regular Rate:Normal     Neuro/Psych  Headaches, Cervical fusion negative neurological ROS  negative psych ROS   GI/Hepatic negative GI ROS, Neg liver ROS,   Endo/Other  negative endocrine ROS  Renal/GU negative Renal ROS  negative genitourinary   Musculoskeletal   Abdominal   Peds  Hematology negative hematology ROS (+)   Anesthesia Other Findings   Reproductive/Obstetrics negative OB ROS                          Anesthesia Physical Anesthesia Plan  ASA: II  Anesthesia Plan: General   Post-op Pain Management:    Induction: Intravenous  Airway Management Planned: Oral ETT  Additional Equipment:   Intra-op Plan:   Post-operative Plan: Extubation in OR  Informed Consent: I have reviewed the patients History and Physical, chart, labs and discussed the procedure including the risks, benefits and alternatives for the proposed anesthesia with the patient or authorized representative who has indicated his/her understanding and acceptance.   Dental Advisory Given  Plan Discussed with: CRNA and Surgeon  Anesthesia Plan Comments:        Anesthesia Quick Evaluation

## 2011-09-13 NOTE — Transfer of Care (Signed)
Immediate Anesthesia Transfer of Care Note  Patient: Tina [name]  Procedure(s) Performed: Procedure(s) (LRB): TOTAL HIP ARTHROPLASTY ANTERIOR APPROACH (Right)  Patient Location: PACU  Anesthesia Type: General  Level of Consciousness: awake, alert , oriented, patient cooperative and responds to stimulation  Airway & Oxygen Therapy: Patient Spontanous Breathing  Post-op Assessment: Report given to PACU RN, Post -op Vital signs reviewed and stable and Patient moving all extremities  Post vital signs: Reviewed and stable  Complications: No apparent anesthesia complications

## 2011-09-14 LAB — CBC
HCT: 28.4 % — ABNORMAL LOW (ref 36.0–46.0)
MCHC: 32.7 g/dL (ref 30.0–36.0)
Platelets: 281 10*3/uL (ref 150–400)
RDW: 14.5 % (ref 11.5–15.5)
WBC: 10.1 10*3/uL (ref 4.0–10.5)

## 2011-09-14 LAB — BASIC METABOLIC PANEL
BUN: 9 mg/dL (ref 6–23)
Chloride: 102 mEq/L (ref 96–112)
Creatinine, Ser: 0.74 mg/dL (ref 0.50–1.10)
GFR calc Af Amer: >90 mL/min (ref >90)
GFR calc non Af Amer: 90 mL/min — ABNORMAL LOW (ref >90)
Potassium: 4.6 mEq/L (ref 3.5–5.1)

## 2011-09-14 MED ORDER — DSS 100 MG PO CAPS: 100.0000 mg | ORAL_CAPSULE | Freq: Two times a day (BID) | ORAL | Status: AC

## 2011-09-14 MED ORDER — NICOTINE 14 MG/24HR TD PT24
14.0000 mg | MEDICATED_PATCH | Freq: Every day | TRANSDERMAL | Status: DC
  Administered 2011-09-14 – 2011-09-16 (×3): 14 mg via TRANSDERMAL
  Filled 2011-09-14 (×3): qty 1

## 2011-09-14 MED ORDER — ASPIRIN EC 325 MG PO TBEC: 325.0000 mg | DELAYED_RELEASE_TABLET | Freq: Two times a day (BID) | ORAL | Status: AC

## 2011-09-14 MED ORDER — METHOCARBAMOL 500 MG PO TABS: 500.0000 mg | ORAL_TABLET | Freq: Four times a day (QID) | ORAL | Status: AC | PRN

## 2011-09-14 MED ORDER — DIPHENHYDRAMINE HCL 25 MG PO CAPS: 25.0000 mg | ORAL_CAPSULE | Freq: Four times a day (QID) | ORAL | Status: DC | PRN

## 2011-09-14 MED ORDER — HYDROCODONE-ACETAMINOPHEN 7.5-325 MG PO TABS: 1.0000 | ORAL_TABLET | ORAL | Status: AC

## 2011-09-14 MED ORDER — POLYETHYLENE GLYCOL 3350 17 G PO PACK: 17.0000 g | PACK | Freq: Two times a day (BID) | ORAL | Status: AC

## 2011-09-14 MED ORDER — FERROUS SULFATE 325 (65 FE) MG PO TABS: 325.0000 mg | ORAL_TABLET | Freq: Three times a day (TID) | ORAL | Status: DC

## 2011-09-14 NOTE — Evaluation (Signed)
Physical Therapy Evaluation Patient Details Name: Tina [name] MRN: [id number] DOB: 03/12/1951 Today's Date: 09/14/2011  Problem List:  Patient Active Problem List  Diagnoses  . S/P right THA, AA    Past Medical History:  Past Medical History  Diagnosis Date  . Headache     occasional  . Anxiety   . Depression   . Arthritis     bilateral hips , left hand    Past Surgical History:  Past Surgical History  Procedure Date  . Cervical fusion     anterior 7/12     PT Assessment/Plan/Recommendation PT Assessment Clinical Impression Statement: 61 y.o. 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 Pt reports h/o multiple falls due to vertigo PTA. She will have 24* assist at home. Will need RW and HHPT. Would benefit from acute PT for decreased strength and decreased mobility. Good progress expected.  PT Recommendation/Assessment: Patient will need skilled PT in the acute care venue PT Problem List: Decreased strength;Decreased activity tolerance;Decreased mobility;Decreased knowledge of use of DME;Pain Barriers to Discharge: None PT Therapy Diagnosis : Difficulty walking;Acute pain PT Plan PT Frequency: 7X/week PT Treatment/Interventions: DME instruction;Gait training;Stair training;Therapeutic activities;Therapeutic exercise;Patient/family education PT Recommendation Recommendations for Other Services: OT consult Follow Up Recommendations: Home health PT Equipment Recommended: Rolling walker with 5" wheels PT Goals  Acute Rehab PT Goals PT Goal Formulation: With patient Time For Goal Achievement: 3 days Pt will go Supine/Side to Sit: with modified independence;with HOB 0 degrees PT Goal: Supine/Side to Sit - Progress: Goal set today Pt will go Sit to Stand: with modified independence;with upper extremity assist PT Goal: Sit to Stand - Progress: Goal set today Pt will Ambulate: 51 - 150 feet;with modified independence;with rolling walker PT Goal: Ambulate -  Progress: Goal set today Pt will Go Up / Down Stairs: 3-5 stairs;with min assist;with least restrictive assistive device PT Goal: Up/Down Stairs - Progress: Goal set today Pt will Perform Home Exercise Program: with min assist PT Goal: Perform Home Exercise Program - Progress: Goal set today  PT Evaluation Precautions/Restrictions  Precautions Precaution Comments: None-direct anterior hip Required Braces or Orthoses: No Restrictions Weight Bearing Restrictions: No Prior Functioning  Home Living Lives With: Family Receives Help From: Family Type of Home: House Home Layout: One level Home Access: Stairs to enter Entrance Stairs-Rails: Right;Left (cannot reach both) Entrance Stairs-Number of Steps: 3 Home Adaptive Equipment: Straight cane;Shower chair with back Prior Function Level of Independence: Requires assistive device for independence;Independent with transfers;Independent with basic ADLs Able to Take Stairs?: Yes Driving: No Vocation: On disability Cognition Cognition Arousal/Alertness: Awake/alert Overall Cognitive Status: Appears within functional limits for tasks assessed Orientation Level: Oriented X4 Sensation/Coordination Sensation Light Touch: Appears Intact Coordination Gross Motor Movements are Fluid and Coordinated: Yes Fine Motor Movements are Fluid and Coordinated: Yes Extremity Assessment RUE Assessment RUE Assessment: Within Functional Limits LUE Assessment LUE Assessment: Within Functional Limits RLE Assessment RLE Assessment: Exceptions to [facility] (R hip internally rotated with knee flexed at rest) RLE Strength Right Hip Flexion: 2-/5 Right Knee Extension: 2/5 LLE Assessment LLE Assessment: Within Functional Limits Mobility (including Balance) Bed Mobility Bed Mobility: Yes Supine to Sit: 4: Min assist;HOB elevated (Comment degrees) Supine to Sit Details (indicate cue type and reason): HOB 30*, minA to support RLE Transfers Transfers: Yes Sit  to Stand: 4: Min assist;From bed;With upper extremity assist Sit to Stand Details (indicate cue type and reason): pt 75%, VCs hand placement Stand to Sit: 4: Min assist;To chair/3-in-1;With armrests;With  upper extremity assist Stand to Sit Details: VCs hand placement Ambulation/Gait Ambulation/Gait: Yes Ambulation/Gait Assistance: 5: Supervision Ambulation/Gait Assistance Details (indicate cue type and reason): VCs for sequencing and positioning in RW, pt 85%, distance limited by pain/fatigue Ambulation Distance (Feet): 6 Feet Assistive device: Rolling walker Gait Pattern: Step-to pattern Stairs: No    Exercise  Total Joint Exercises Ankle Circles/Pumps: AROM;10 reps;Both Quad Sets: AROM;Both;10 reps;Supine Heel Slides: AAROM;Right;10 reps;Supine Hip ABduction/ADduction: AAROM;Right;5 reps;Supine End of Session PT - End of Session Equipment Utilized During Treatment: Gait belt Activity Tolerance: Patient limited by fatigue;Patient limited by pain Patient left: in chair;with call bell in reach Nurse Communication: Mobility status for transfers;Mobility status for ambulation General Behavior During Session: [facility] for tasks performed Cognition: [facility] for tasks performed  Tamala Ser 09/14/2011, 10:28 AM [phone]

## 2011-09-14 NOTE — Progress Notes (Signed)
Subjective: 1 Day Post-Op Procedure(s) (LRB): TOTAL HIP ARTHROPLASTY ANTERIOR APPROACH (Right)   Patient reports pain as mild. Feels much better as compared to before surgery. Looing forward to being able to walk and get back to living life. No events.  Objective:   VITALS:   Filed Vitals:   09/14/11 0507  BP: 112/67  Pulse: 70  Temp: 98.5 F (36.9 C)  Resp: 16    Neurovascular intact Dorsiflexion/Plantar flexion intact Incision: dressing C/D/I No cellulitis present Compartment soft  LABS  Basename 09/14/11 0435  HGB 9.3*  HCT 28.4*  WBC 10.1  PLT 281     Basename 09/14/11 0435  NA 134*  K 4.6  BUN 9  CREATININE 0.74  GLUCOSE 124*     Assessment/Plan: 1 Day Post-Op Procedure(s) (LRB): TOTAL HIP ARTHROPLASTY ANTERIOR APPROACH (Right)   HV drain d/c'ed Foley cath d/c'ed Advance diet Up with therapy Plan for discharge tomorrow to home continues to do well   Tina [name]. Dierks Wach   PAC  09/14/2011, 8:53 AM

## 2011-09-14 NOTE — Progress Notes (Signed)
OT Cancellation Note  ___Treatment cancelled today due to medical issues with patient which prohibited therapy  ___ Treatment cancelled today due to patient receiving procedure or test   _x__ Treatment cancelled today due to patient's refusal to participate. Pt states she is in too much pain this pm. Requested OT check back on 09/15/11.   ___ Treatment cancelled today due to   Signature: Garrel Ridgel, OTR/L  Pager [phone] 09/14/2011

## 2011-09-14 NOTE — Progress Notes (Signed)
Physical Therapy Treatment Patient Details Name: Tina [name] MRN: [id number] DOB: 1951-04-13 Today's Date: 09/14/2011  PT Assessment/Plan  PT - Assessment/Plan Comments on Treatment Session: Pt fatigued from being up in chair for the morning. Tearful 2* pain/fatigue.  PT Plan: Discharge plan remains appropriate PT Frequency: 7X/week Recommendations for Other Services: OT consult Follow Up Recommendations: Home health PT Equipment Recommended: Rolling walker with 5" wheels PT Goals  Acute Rehab PT Goals PT Goal Formulation: With patient Time For Goal Achievement: 3 days Pt will go Supine/Side to Sit: with modified independence;with HOB 0 degrees PT Goal: Supine/Side to Sit - Progress: Goal set today Pt will go Sit to Stand: with modified independence;with upper extremity assist PT Goal: Sit to Stand - Progress: Progressing toward goal Pt will Ambulate: 51 - 150 feet;with modified independence;with rolling walker PT Goal: Ambulate - Progress: Progressing toward goal Pt will Go Up / Down Stairs: 3-5 stairs;with min assist;with least restrictive assistive device PT Goal: Up/Down Stairs - Progress: Goal set today Pt will Perform Home Exercise Program: with min assist PT Goal: Perform Home Exercise Program - Progress: Goal set today  PT Treatment Precautions/Restrictions  Precautions Precaution Comments: None-direct anterior hip Required Braces or Orthoses: No Restrictions Weight Bearing Restrictions: No Mobility (including Balance) Bed Mobility Bed Mobility: Yes Supine to Sit: 4: Min assist;HOB elevated (Comment degrees) Supine to Sit Details (indicate cue type and reason): HOB 30*, minA to support RLE Sit to Supine: HOB flat;4: Min assist Sit to Supine - Details (indicate cue type and reason): pt 75%, assist to bring BLEs into bed Transfers Transfers: Yes Sit to Stand: 4: Min assist;With upper extremity assist;From chair/3-in-1;With armrests Sit to Stand Details (indicate  cue type and reason): pt 75%, assist for balance Stand to Sit: 5: Supervision;To bed;With upper extremity assist Stand to Sit Details: VCs for hand placement Ambulation/Gait Ambulation/Gait: Yes Ambulation/Gait Assistance: 4: Min assist Ambulation/Gait Assistance Details (indicate cue type and reason): min A for balance, pt fatigued, VCs sequencing Ambulation Distance (Feet): 3 Feet Assistive device: Rolling walker Gait Pattern: Step-to pattern Stairs: No    Exercise  Total Joint Exercises Ankle Circles/Pumps: AROM;10 reps;Both Quad Sets: AROM;Both;10 reps;Supine Heel Slides: AAROM;Right;10 reps;Supine Hip ABduction/ADduction: AAROM;Right;5 reps;Supine End of Session PT - End of Session Equipment Utilized During Treatment: Gait belt Activity Tolerance: Patient limited by fatigue;Patient limited by pain Patient left: in bed;with call bell in reach Nurse Communication: Mobility status for transfers;Mobility status for ambulation General Behavior During Session: [facility] for tasks performed Cognition: [facility] for tasks performed  Tamala Ser 09/14/2011, 12:21 PM [phone]

## 2011-09-14 NOTE — Progress Notes (Signed)
Utilization review completed.  

## 2011-09-15 LAB — BASIC METABOLIC PANEL
Calcium: 8.4 mg/dL (ref 8.4–10.5)
GFR calc Af Amer: >90 mL/min (ref >90)
GFR calc non Af Amer: >90 mL/min (ref >90)
Glucose, Bld: 128 mg/dL — ABNORMAL HIGH (ref 70–99)
Potassium: 3.6 mEq/L (ref 3.5–5.1)
Sodium: 134 mEq/L — ABNORMAL LOW (ref 135–145)

## 2011-09-15 LAB — CBC
MCH: 28.9 pg (ref 26.0–34.0)
MCHC: 32.5 g/dL (ref 30.0–36.0)
Platelets: 266 10*3/uL (ref 150–400)
RDW: 14.7 % (ref 11.5–15.5)

## 2011-09-15 NOTE — Progress Notes (Signed)
CARE MANAGEMENT NOTE 09/15/2011  Patient:  Tina [name], Tina [name]   Account Number:  [id number]  Date Initiated:  09/15/2011  Documentation initiated by:  Colleen Can  Subjective/Objective Assessment:   dx osteoarthritis right hip; total hip replacemnt -anterior approach     Action/Plan:   Cm spoke with patient. Initial plan was for return to home with family as caregivers.  However after PT session pt not progressing and realized she needed snf rehab   Anticipated DC Date:  09/16/2011   Anticipated DC Plan:  SKILLED NURSING FACILITY  In-house referral  Clinical Social Worker      DC Planning Services  CM consult      [facility] Choice  NA   Choice offered to / List presented to:  NA   DME arranged  NA      DME agency  NA     HH arranged  NA      HH agency  NA   Status of service:  Completed, signed off Medicare Important Message given?  NA - LOS <3 / Initial given by admissions (If response is "NO", the following Medicare IM given date fields will be blank)

## 2011-09-15 NOTE — Progress Notes (Signed)
CSW met with patient. Patient is alert and oriented. Discussed patients desire for SNF. Patient would like to receive bed offers as she feels she can not go home right away. FL2 completed and palced on chart for doctor signature. Patient faxed out to local SNF's. CSW [place] will follow tomorrow for bed offers.  Zayveon Raschke C. Andres Vest MSW, LCSW [phone]

## 2011-09-15 NOTE — Progress Notes (Signed)
Physical Therapy Treatment Patient Details Name: Tina [name] MRN: [id number] DOB: 1951-06-29 Today's Date: 09/15/2011  PT Assessment/Plan  PT - Assessment/Plan Comments on Treatment Session: Pt realizes she is not progressing as expected and becomes tearful over lack of progress.  When questioned about d/c plan, pt states she likes her privacy and knows she probably needs to go SNF for further therapy but prefers to go home.  States she will "probably just stay in the bed all the time except when therapy comes".  RN to discuss with Care Management.  At this point, pt requires min assist for mobility and needs 24 hour assist.  Pt required moderate assist for stair instruction in am session.  Spoke with patient's nurse who will contact Dr Charlann Boxer about possible delay in d/c.  Pt states she is agreeable to SNF for further therapy and is actually "relieved to not be going home" at this point. PT Plan: Recommend SNF for further therapies. PT Frequency: 7X/week Recommendations for Other Services: OT consult Follow Up Recommendations: Skilled nursing facility Equipment Recommended: Rolling walker with 5" wheels;3 in 1 bedside comode PT Goals  Acute Rehab PT Goals PT Goal: Supine/Side to Sit - Progress: Progressing toward goal PT Goal: Sit to Stand - Progress: Progressing toward goal PT Goal: Ambulate - Progress: Progressing toward goal PT Goal: Up/Down Stairs - Progress: Progressing toward goal PT Goal: Perform Home Exercise Program - Progress: Progressing toward goal  PT Treatment Precautions/Restrictions  Precautions Precaution Comments: none direct anterior. Required Braces or Orthoses: No Restrictions Weight Bearing Restrictions: No Other Position/Activity Restrictions: Encouraged pt to keep R LE in hip and knee extension-no pillows under knee Mobility (including Balance) Bed Mobility Bed Mobility: Yes Supine to Sit: 4: Min assist;HOB flat Supine to Sit Details (indicate cue type and  reason): min assist for R LE and trunk Sit to Supine: 4: Min assist;HOB flat Sit to Supine - Details (indicate cue type and reason): assist for LE's Transfers Transfers: Yes Sit to Stand: 4: Min assist;With upper extremity assist;From bed Sit to Stand Details (indicate cue type and reason): cues for hand placement and technique Stand to Sit: 4: Min assist;With upper extremity assist;To bed Stand to Sit Details: cues for hand placement Ambulation/Gait Ambulation/Gait: Yes Ambulation/Gait Assistance: 4: Min assist Ambulation/Gait Assistance Details (indicate cue type and reason): cues for upright posture and to encourage R hip/knee extension.  Pt tends to keep R knee flexed and heel off floor. Ambulation Distance (Feet): 25 Feet Assistive device: Rolling walker Gait Pattern: Step-to pattern;Decreased step length - right;Decreased step length - left;Decreased stance time - right;Decreased weight shift to right;Decreased weight shift to left;Antalgic Gait velocity: decreased Stairs: No Stairs Assistance: 3: Mod assist Stairs Assistance Details (indicate cue type and reason): Pt required significant assist for technique and weightbearing.  Poor balance.  Heavily leans on rails. Stair Management Technique: Two rails;Step to pattern Number of Stairs: 2  Wheelchair Mobility Wheelchair Mobility: No  Posture/Postural Control Posture/Postural Control: Postural limitations Postural Limitations: Bilateral knee and hip flexion in standing and with ambulation. Exercise  Total Joint Exercises Ankle Circles/Pumps: AROM;10 reps;Both Quad Sets: AROM;Both;10 reps;Supine Heel Slides: AAROM;Right;10 reps;Supine End of Session PT - End of Session Equipment Utilized During Treatment: Gait belt Activity Tolerance: Patient limited by fatigue;Patient limited by pain Patient left: in bed;with call bell in reach Nurse Communication: Mobility status for transfers;Mobility status for  ambulation General Behavior During Session: Other (comment) (tearful, especially when discussing lack of progress and d/c) Cognition: [facility] for tasks  performed  Newell Coral 09/15/2011, 1:51 PM  Newell Coral, PTA  Have reviewed case with PTA and agree with change in d/c plan.

## 2011-09-15 NOTE — Progress Notes (Signed)
Physical Therapy Treatment Patient Details Name: Tina [name] MRN: [id number] DOB: 12-18-1950 Today's Date: 09/15/2011  PT Assessment/Plan  PT - Assessment/Plan Comments on Treatment Session: Pt reports her son "carries" her up the steps at home.  Pt moving very slowly and requiring moderate cues for all aspects of mobility.  Pt for possible d/c this pm. PT Plan: Discharge plan remains appropriate PT Frequency: 7X/week Recommendations for Other Services: OT consult Follow Up Recommendations: Home health PT Equipment Recommended: Rolling walker with 5" wheels;3 in 1 bedside comode PT Goals  Acute Rehab PT Goals PT Goal: Supine/Side to Sit - Progress: Progressing toward goal PT Goal: Sit to Stand - Progress: Progressing toward goal PT Goal: Ambulate - Progress: Progressing toward goal PT Goal: Up/Down Stairs - Progress: Progressing toward goal  PT Treatment Precautions/Restrictions  Precautions Precaution Comments: none direct anterior. Required Braces or Orthoses: No Restrictions Weight Bearing Restrictions: No Mobility (including Balance) Bed Mobility Bed Mobility: Yes Supine to Sit: 4: Min assist;HOB elevated (Comment degrees) (40 degrees) Supine to Sit Details (indicate cue type and reason): min assist for RLE, cues for technique, increased time Sit to Supine: 4: Min assist;HOB flat Sit to Supine - Details (indicate cue type and reason): assist for LE's Transfers Transfers: Yes Sit to Stand: 4: Min assist;With upper extremity assist;With armrests;From bed;From chair/3-in-1 Sit to Stand Details (indicate cue type and reason): Min assist to balance, cues for technique and LE placement.  LOB times once posteriorly upon initial standing Stand to Sit: 4: Min assist;With upper extremity assist;With armrests;To bed;To chair/3-in-1 Stand to Sit Details: cues for hand placement and technique Ambulation/Gait Ambulation/Gait: Yes Ambulation/Gait Assistance: 4: Min  assist Ambulation/Gait Assistance Details (indicate cue type and reason): Pt needs moderate assist for technique (to use UE's, LE placement, walker placement).  Pt limited by fatique. Ambulation Distance (Feet): 10 Feet (times 2) Assistive device: Rolling walker Gait Pattern: Step-to pattern;Decreased step length - right;Decreased step length - left;Decreased stance time - right;Decreased weight shift to right;Decreased weight shift to left;Antalgic (Decreased hip/knee extension bilaterally) Gait velocity: decreased Stairs: Yes Stairs Assistance: 3: Mod assist Stairs Assistance Details (indicate cue type and reason): Pt required significant assist for technique and weightbearing.  Poor balance.  Heavily leans on rails. Stair Management Technique: Two rails;Step to pattern Number of Stairs: 2  Wheelchair Mobility Wheelchair Mobility: No  Posture/Postural Control Posture/Postural Control: Postural limitations Postural Limitations: Bilateral knee and hip flexion in standing and with ambulation. Exercise    End of Session PT - End of Session Equipment Utilized During Treatment: Gait belt Activity Tolerance: Patient limited by fatigue;Patient limited by pain Patient left: in bed;with call bell in reach Nurse Communication: Mobility status for transfers;Mobility status for ambulation General Behavior During Session: Other (comment) (Slightly tearful) Cognition: [facility] for tasks performed  Newell Coral 09/15/2011, 12:42 PM  Newell Coral, PTA

## 2011-09-15 NOTE — Discharge Summary (Signed)
Physician Discharge Summary  Patient ID: Tina [name] MRN: [id number] DOB/AGE: Oct 06, 1950 61 y.o.  Admit date: 09/13/2011 Discharge date:  09/16/2011  Procedures:  Procedure(s) (LRB): TOTAL HIP ARTHROPLASTY ANTERIOR APPROACH (Right)  Attending Physician: Shelda Pal, MD   Admission Diagnoses: Osteoarthritis, right hip  Discharge Diagnoses:  Principal Problem:  *S/P right THA, AA Anxiety Depression Vertigo Arthritis  HPI: This is a 61 year old lady with a history of osteoarthritis of right hip that has failed conservative management. After discussion of treatments, benefits, risks, and options, the patient is now scheduled for total hip arthroplasty of the right hip by anterior approach. Note that, the patient is a candidate for tranexamic acid and dexamethasone, which she will receive both at surgery. Her medical doctor is Dr. Marylouise Stacks at [facility]. She does plan on going home after surgery and she is given her home medications of iron, Robaxin, MiraLax, Colace, and aspirin.  PCP: Dr. Marylouise Stacks at [facility]  Discharged Condition: good  Hospital Course:  Patient underwent the above stated procedure on 09/13/2011. Patient tolerated the procedure well and brought to the recovery room in good condition and subsequently to the floor.  POD #1 BP: 112/67 ; Pulse: 70 ; Temp: 98.5 F (36.9 C) ; Resp: 16  Pt's foley was removed, as well as the hemovac drain removed. IV was changed to a saline lock. Patient reports pain as mild. Feels much better as compared to before surgery. Looking forward to being able to walk and get back to living life. No events. Neurovascular intact, dorsiflexion/plantar flexion intact, incision: dressing C/D/I, no cellulitis present and compartment soft.   LABS  Basename  09/14/11 0435   HGB  9.3  HCT  28.4    POD #2  BP: 95/62 ; Pulse: 82 ; Temp: 97.8 F (36.6 C) ; Resp: 16  Patient reports pain as mild. Pt  stated that she had some pain yesterday afternoon, but doing better this morning. She seems depressed and gets discouraged quickly. After a talk she seems more motivated. If she does well with PT she states that she is ready to be discharged home. Neurovascular intact, dorsiflexion/plantar flexion intact, incision: dressing C/D/I, no cellulitis present and compartment soft.  LABS  Basename  09/15/11 0418   HGB  8.7  HCT  26.8    POD #3 BP: 100/60 ; Pulse: 78 ; Temp: 98.2 F (36.8 C) ; Resp: 14  Patient reports pain as mild. Pt is having slow progress with PT. Otherwise no events. Looking for a SNF was initiated yesterday since the pt realizes that she doesn't have anyone that can take care of her at home. Discharge to SNF when available. Neurovascular intact, dorsiflexion/plantar flexion intact, incision: dressing C/D/I, no cellulitis present and compartment soft.   LABS  No new labs   Discharge Exam: General appearance: alert, cooperative and no distress Extremities: Homans sign is negative, no sign of DVT, no edema, redness or tenderness in the calves or thighs and no ulcers, gangrene or trophic changes  Disposition:   SNF with follow up in 2 weeks  Follow-up Information    Follow up with OLIN,Wanona Stare D in 2 weeks.   Contact information:   [facility] [address], [address] [place] [address] [phone]          Discharge Orders    Future Orders Please Complete By Expires   Diet - low sodium heart healthy      Call MD /  Call 911      Comments:   If you experience chest pain or shortness of breath, CALL 911 and be transported to the hospital emergency room.  If you develope a fever above 101 F, pus (white drainage) or increased drainage or redness at the wound, or calf pain, call your surgeon's office.   Discharge instructions      Comments:   Maintain surgical dressing for 8 days, then replace with gauze and tape. Keep the area dry  and clean until follow up. Follow up in 2 weeks at [facility]. Call with any questions or concerns.     Constipation Prevention      Comments:   Drink plenty of fluids.  Prune juice may be helpful.  You may use a stool softener, such as Colace (over the counter) 100 mg twice a day.  Use MiraLax (over the counter) for constipation as needed.   Increase activity slowly as tolerated      Weight Bearing as taught in Physical Therapy      Comments:   Use a walker or crutches as instructed.   Driving restrictions      Comments:   No driving for 4 weeks   Change dressing      Comments:   Maintain surgical dressing for 8 days, then replace with 4x4 guaze and tape. Keep the area dry and clean.   TED hose      Comments:   Use stockings (TED hose) for 2 weeks on both leg(s).  You may remove them at night for sleeping.      Current Discharge Medication List    START taking these medications   Details  diphenhydrAMINE (BENADRYL) 25 mg capsule Take 1 capsule (25 mg total) by mouth every 6 (six) hours as needed for itching, allergies or sleep.    docusate sodium 100 MG CAPS Take 100 mg by mouth 2 (two) times daily.    ferrous sulfate 325 (65 FE) MG tablet Take 1 tablet (325 mg total) by mouth 3 (three) times daily after meals.    HYDROcodone-acetaminophen (NORCO) 7.5-325 MG per tablet Take 1-2 tablets by mouth every 4 (four) hours. Qty: 120 tablet, Refills: 0    methocarbamol (ROBAXIN) 500 MG tablet Take 1 tablet (500 mg total) by mouth every 6 (six) hours as needed (muscle spasms).    polyethylene glycol (MIRALAX / GLYCOLAX) packet Take 17 g by mouth 2 (two) times daily.      CONTINUE these medications which have CHANGED   Details  aspirin EC 325 MG tablet Take 1 tablet (325 mg total) by mouth 2 (two) times daily. X 4 weeks Qty: 60 tablet, Refills: 0      CONTINUE these medications which have NOT CHANGED   Details  clonazePAM (KLONOPIN) 1 MG tablet Take 1 mg by mouth 3  (three) times daily.    DULoxetine (CYMBALTA) 60 MG capsule Take 120 mg by mouth every morning.    sertraline (ZOLOFT) 100 MG tablet Take 200 mg by mouth every morning.    traZODone (DESYREL) 50 MG tablet Take 50-100 mg by mouth at bedtime.      STOP taking these medications     naproxen sodium (ANAPROX) 220 MG tablet Comments:  Reason for Stopping:          Signed:  Anastasio Auerbach. Angeligue Bowne   PAC  09/16/2011, 8:24 AM

## 2011-09-15 NOTE — Evaluation (Signed)
Occupational Therapy Evaluation Patient Details Name: Tina [name] MRN: [id number] DOB: [birth year] Today's Date: 09/15/2011  Problem List:  Patient Active Problem List  Diagnoses  . S/P right THA, AA    Past Medical History:  Past Medical History  Diagnosis Date  . Headache     occasional  . Anxiety   . Depression   . Arthritis     bilateral hips , left hand    Past Surgical History:  Past Surgical History  Procedure Date  . Cervical fusion     anterior 7/12     OT Assessment/Plan/Recommendation OT Assessment Clinical Impression Statement: Pt is a 61 yo female who presents w/RTHA anterior approach. Skilled OT recommended to maximize I w/BADLs to supervision level in prep for safe d/c home with HHOT. OT Recommendation/Assessment: Patient will need skilled OT in the acute care venue OT Problem List: Decreased activity tolerance;Decreased safety awareness;Decreased knowledge of use of DME or AE OT Therapy Diagnosis : Generalized weakness OT Plan OT Frequency: Min 2X/week OT Treatment/Interventions: Self-care/ADL training;Therapeutic activities;DME and/or AE instruction;Patient/family education OT Recommendation Follow Up Recommendations: Home health OT Equipment Recommended: Rolling walker with 5" wheels;3 in 1 bedside comode Individuals Consulted Consulted and Agree with Results and Recommendations: Patient OT Goals Acute Rehab OT Goals OT Goal Formulation: With patient Time For Goal Achievement: 2 weeks ADL Goals Pt Will Perform Grooming: with supervision;Standing at sink (X 3 tasks to improve standing activity tolerance.) ADL Goal: Grooming - Progress: Goal set today Pt Will Perform Lower Body Bathing: with supervision;Sit to stand from chair;Sit to stand from bed ADL Goal: Lower Body Bathing - Progress: Goal set today Pt Will Perform Lower Body Dressing: with supervision;Sit to stand from bed;Sit to stand from chair ADL Goal: Lower Body Dressing - Progress:  Goal set today Pt Will Transfer to Toilet: with supervision;3-in-1;Ambulation ADL Goal: Toilet Transfer - Progress: Goal set today Pt Will Perform Toileting - Clothing Manipulation: with supervision;Standing ADL Goal: Toileting - Clothing Manipulation - Progress: Goal set today Pt Will Perform Toileting - Hygiene: with supervision;Sit to stand from 3-in-1/toilet ADL Goal: Toileting - Hygiene - Progress: Goal set today  OT Evaluation Precautions/Restrictions  Precautions Precaution Comments: none direct anterior. Required Braces or Orthoses: No Restrictions Weight Bearing Restrictions: No Prior Functioning Home Living Lives With: Family Receives Help From: Family Type of Home: House Home Layout: One level Home Access: Stairs to enter Entrance Stairs-Rails: Right;Left (cannot reach both) Entrance Stairs-Number of Steps: 3 Bathroom Shower/Tub: Engineer, manufacturing systems: Standard Home Adaptive Equipment: Straight cane;Shower chair with back Prior Function Level of Independence: Independent with basic ADLs;Requires assistive device for independence;Independent with transfers Driving: No Vocation: On disability ADL ADL Grooming: Simulated;Set up Where Assessed - Grooming: Sitting, bed;Unsupported Upper Body Bathing: Simulated;Set up Where Assessed - Upper Body Bathing: Sitting, bed;Unsupported Lower Body Bathing: Simulated;Moderate assistance Where Assessed - Lower Body Bathing: Sit to stand from bed Upper Body Dressing: Simulated;Set up Where Assessed - Upper Body Dressing: Sit to stand from bed Lower Body Dressing: Simulated;Moderate assistance Where Assessed - Lower Body Dressing: Sit to stand from bed Toilet Transfer: Performed;Minimal assistance Toilet Transfer Details (indicate cue type and reason): max cues for technique, hand placement, posture. Pt struggled to rise from low toilet. Toilet Transfer Method: Proofreader: Regular height  toilet Toileting - Clothing Manipulation: Performed;Minimal assistance Where Assessed - Toileting Clothing Manipulation: Sit to stand from 3-in-1 or toilet Toileting - Hygiene: Performed;Minimal assistance Where Assessed - Toileting Hygiene: Sit to stand from  3-in-1 or toilet Tub/Shower Transfer: Not assessed (Pt states she will be spongebathing.) Tub/Shower Transfer Method: Not assessed Equipment Used: Rolling walker Ambulation Related to ADLs: Pt ambulated to the bathroom w/min A. Max cues for safe manipulation of RW. B knees have a tendency to buckle. Fatigues very quickly. Vision/Perception    Cognition Cognition Arousal/Alertness: Awake/alert Overall Cognitive Status: Appears within functional limits for tasks assessed Orientation Level: Oriented X4 Cognition - Other Comments: Pt very emotionally labile, cries frequently. Sensation/Coordination   Extremity Assessment RUE Assessment RUE Assessment: Within Functional Limits LUE Assessment LUE Assessment: Within Functional Limits Mobility  Bed Mobility Sit to Supine: 3: Mod assist;HOB flat Sit to Supine - Details (indicate cue type and reason): Pt required A to bring both legs into bed. Transfers Sit to Stand: 4: Min assist;From elevated surface;From bed;From toilet Sit to Stand Details (indicate cue type and reason): cues for ue and le position Stand to Sit: To toilet;To bed;4: Min assist;With upper extremity assist Stand to Sit Details: cues for ue and le position Exercises   End of Session OT - End of Session Activity Tolerance: Patient tolerated treatment well Patient left: in bed;with call bell in reach General Behavior During Session: Other (comment) (tearful and emotional) Cognition: WFL for tasks performed   Shelaine Frie A OTR/L [phone] 09/15/2011, 9:35 AM

## 2011-09-15 NOTE — Progress Notes (Signed)
Subjective: 2 Days Post-Op Procedure(s) (LRB): TOTAL HIP ARTHROPLASTY ANTERIOR APPROACH (Right)   Patient reports pain as mild. Pt stated that she had some pain yesterday afternoon, but doing better this morning. She seems depressed and gets discouraged quickly. After a talk she seems more motivated. If she does well with PT she states that she is ready to be discharged home.  Objective:   VITALS:   Filed Vitals:   09/15/11 0658  BP: 95/62  Pulse: 82  Temp: 97.8 F (36.6 C)  Resp: 16    Neurovascular intact Dorsiflexion/Plantar flexion intact Incision: dressing C/D/I No cellulitis present Compartment soft  LABS  Basename 09/15/11 0418 09/14/11 0435  HGB 8.7* 9.3*  HCT 26.8* 28.4*  WBC 9.8 10.1  PLT 266 281     Basename 09/15/11 0418 09/14/11 0435  NA 134* 134*  K 3.6 4.6  BUN 9 9  CREATININE 0.55 0.74  GLUCOSE 128* 124*     Assessment/Plan: 2 Days Post-Op Procedure(s) (LRB): TOTAL HIP ARTHROPLASTY ANTERIOR APPROACH (Right)   Up with therapy Discharge home with home health Follow up in 2 weeks at [facility].  Follow-up Information    Follow up with OLIN,Camdin Hegner D in 2 weeks.   Contact information:   [facility] [address] [place] [address] [phone]          Anastasio Auerbach. Stoy Fenn   PAC  09/15/2011, 7:47 AM

## 2011-09-16 NOTE — Progress Notes (Signed)
Physical Therapy Treatment Patient Details Name: Tina [name] MRN: [id number] DOB: 1950/11/03 Today's Date: 09/16/2011  PT Assessment/Plan  PT - Assessment/Plan Comments on Treatment Session: Pt slowly progressing with mobility, plans to DC to SNF today.  PT Plan: Discharge plan needs to be updated (plan is now SNF instead of HHPT) PT Frequency: 7X/week Recommendations for Other Services: OT consult Follow Up Recommendations: Skilled nursing facility Equipment Recommended: Rolling walker with 5" wheels;3 in 1 bedside comode PT Goals  Acute Rehab PT Goals PT Goal Formulation: With patient Time For Goal Achievement: 3 days Pt will go Supine/Side to Sit: with modified independence;with HOB 0 degrees PT Goal: Supine/Side to Sit - Progress: Progressing toward goal Pt will go Sit to Stand: with modified independence;with upper extremity assist PT Goal: Sit to Stand - Progress: Progressing toward goal Pt will Ambulate: 51 - 150 feet;with modified independence;with rolling walker PT Goal: Ambulate - Progress: Progressing toward goal Pt will Go Up / Down Stairs: 3-5 stairs;with min assist;with least restrictive assistive device PT Goal: Up/Down Stairs - Progress: Progressing toward goal Pt will Perform Home Exercise Program: with min assist PT Goal: Perform Home Exercise Program - Progress: Progressing toward goal  PT Treatment Precautions/Restrictions  Precautions Precaution Comments: none direct anterior. Required Braces or Orthoses: No Restrictions Weight Bearing Restrictions: No Other Position/Activity Restrictions: Encouraged pt to keep R LE in hip and knee extension-no pillows under knee Mobility (including Balance) Bed Mobility Bed Mobility: Yes Supine to Sit: 4: Min assist;HOB flat Supine to Sit Details (indicate cue type and reason): pt 75%, assist to elevate trunk Transfers Transfers: Yes Sit to Stand: 4: Min assist;With upper extremity assist;From bed Sit to Stand  Details (indicate cue type and reason): pt 80%, VCs hand placement Stand to Sit: 4: Min assist;With armrests;To chair/3-in-1;With upper extremity assist Stand to Sit Details: VCs hand placement Ambulation/Gait Ambulation/Gait: Yes Ambulation/Gait Assistance: 5: Supervision Ambulation/Gait Assistance Details (indicate cue type and reason): cues for heel strike and for correcting flexed neck posture, pt continues to keep R knee and hip flexed during stance phase of ambulation Ambulation Distance (Feet): 38 Feet Assistive device: Rolling walker Gait Pattern: Step-to pattern;Decreased step length - right;Decreased step length - left;Decreased stance time - right;Decreased weight shift to right;Decreased weight shift to left;Antalgic Gait velocity: decreased Stairs: No  Posture/Postural Control Postural Limitations: Bilateral knee and hip flexion in standing and with ambulation. Exercise  Total Joint Exercises Ankle Circles/Pumps: AROM;10 reps;Both Quad Sets: AROM;Both;10 reps;Supine Heel Slides: AAROM;Right;10 reps;Supine Hip ABduction/ADduction: AAROM;Right;10 reps;Supine Straight Leg Raises: AAROM;Right;5 reps;Supine Long Arc Quad: AAROM;Right;10 reps;Seated End of Session PT - End of Session Equipment Utilized During Treatment: Gait belt Activity Tolerance: Patient limited by fatigue (BUEs fatigued with use of RW) Patient left: in chair;with call bell in reach Nurse Communication: Mobility status for transfers;Mobility status for ambulation General Behavior During Session: Other (comment) (less teary today) Cognition: WFL for tasks performed  Ralene Bathe Kistler 09/16/2011, 10:14 AM [phone]

## 2011-09-16 NOTE — Progress Notes (Signed)
Pt was d/c to [facility] today via [facility] transport for ST SNF placement.

## 2011-09-16 NOTE — Progress Notes (Signed)
Subjective: 3 Days Post-Op Procedure(s) (LRB): TOTAL HIP ARTHROPLASTY ANTERIOR APPROACH (Right)   Patient reports pain as mild. No events. Looking for a SNF was initiated yesterday since the pt realizes that she doesn't have anyone that can take care of her at home. Discharge to SNF when available.  Objective:   VITALS:   Filed Vitals:   09/16/11 0748  BP: 100/60  Pulse: 78  Temp: 98.2 F (36.8 C)  Resp: 14    Neurovascular intact Dorsiflexion/Plantar flexion intact Incision: dressing C/D/I No cellulitis present Compartment soft  LABS No new labs  Assessment/Plan: 3 Days Post-Op Procedure(s) (LRB): TOTAL HIP ARTHROPLASTY ANTERIOR APPROACH (Right)   Up with therapy Discharge to SNF when available   Anastasio Auerbach. Adorian Gwynne   PAC  09/16/2011, 8:18 AM

## 2011-10-05 ENCOUNTER — Encounter (HOSPITAL_COMMUNITY): Payer: Self-pay | Admitting: Orthopedic Surgery

## 2014-02-23 ENCOUNTER — Emergency Department (HOSPITAL_BASED_OUTPATIENT_CLINIC_OR_DEPARTMENT_OTHER)
Admission: EM | Admit: 2014-02-23 | Discharge: 2014-02-23 | Disposition: A | Discharge status: Home / Self Care | Payer: Medicare Other | Attending: Emergency Medicine | Admitting: Emergency Medicine

## 2014-02-23 ENCOUNTER — Encounter (HOSPITAL_BASED_OUTPATIENT_CLINIC_OR_DEPARTMENT_OTHER): Payer: Self-pay | Admitting: Emergency Medicine

## 2014-02-23 DIAGNOSIS — Z79899 Other long term (current) drug therapy: Secondary | ICD-10-CM | POA: Insufficient documentation

## 2014-02-23 DIAGNOSIS — B86 Scabies: Secondary | ICD-10-CM | POA: Diagnosis not present

## 2014-02-23 DIAGNOSIS — F17200 Nicotine dependence, unspecified, uncomplicated: Secondary | ICD-10-CM | POA: Insufficient documentation

## 2014-02-23 DIAGNOSIS — F329 Major depressive disorder, single episode, unspecified: Secondary | ICD-10-CM | POA: Diagnosis not present

## 2014-02-23 DIAGNOSIS — R21 Rash and other nonspecific skin eruption: Secondary | ICD-10-CM | POA: Diagnosis present

## 2014-02-23 DIAGNOSIS — F3289 Other specified depressive episodes: Secondary | ICD-10-CM | POA: Insufficient documentation

## 2014-02-23 DIAGNOSIS — F411 Generalized anxiety disorder: Secondary | ICD-10-CM | POA: Insufficient documentation

## 2014-02-23 DIAGNOSIS — Z8739 Personal history of other diseases of the musculoskeletal system and connective tissue: Secondary | ICD-10-CM | POA: Diagnosis not present

## 2014-02-23 MED ORDER — PERMETHRIN 5 % EX CREA: TOPICAL_CREAM | CUTANEOUS | Status: DC

## 2014-02-23 NOTE — ED Notes (Signed)
Patient here with rash to extremities and itching for a couple of days, mo distress

## 2014-02-23 NOTE — ED Notes (Signed)
Pt discharged to home with family. NAD.  

## 2014-02-23 NOTE — ED Provider Notes (Signed)
Medical screening examination/treatment/procedure(s) were performed by non-physician practitioner and as supervising physician I was immediately available for consultation/collaboration.   EKG Interpretation None       Ethelda ChickMartha K Linker, MD 02/23/14 1243

## 2014-02-23 NOTE — ED Provider Notes (Signed)
CSN: [id number]     Arrival date & time 02/23/14  1201 History   First MD Initiated Contact with Patient 02/23/14 1218     Chief Complaint  Patient presents with  . Rash     (Consider location/radiation/quality/duration/timing/severity/associated sxs/prior Treatment) HPI Comments: Patient's 63 year old female who presents to the emergency department complaining of a rash x2 weeks. Patient reports the rash is very itchy, began on her arms and legs in the spring to her hands, back and shoulder. Her daughter and grandchildren are recently diagnosed with scabies, treated with a cream that completely relieved her symptoms. The granddaughter had spent a night at patient's house prior to onset of itching. No new soaps, detergents or pets. No difficulty breathing or swallowing.  Patient is a 63 y.o. female presenting with rash. The history is provided by the patient.  Rash   Past Medical History  Diagnosis Date  . Headache(784.0)     occasional  . Anxiety   . Depression   . Arthritis     bilateral hips , left hand    Past Surgical History  Procedure Laterality Date  . Cervical fusion      anterior 7/12   . Total hip arthroplasty  09/13/2011    Procedure: TOTAL HIP ARTHROPLASTY ANTERIOR APPROACH;  Surgeon: Shelda Pal, MD;  Location: [facility] ORS;  Service: Orthopedics;  Laterality: Right;   No family history on file. History  Substance Use Topics  . Smoking status: Current Every Day Smoker -- 0.25 packs/day for 44 years    Types: Cigarettes  . Smokeless tobacco: Never Used  . Alcohol Use: Yes     Comment: occasional beer    OB History   Grav Para Term Preterm Abortions TAB SAB Ect Mult Living                 Review of Systems  Skin: Positive for rash.  All other systems reviewed and are negative.     Allergies  Review of patient's allergies indicates no known allergies.  Home Medications   Prior to Admission medications   Medication Sig Start Date End Date Taking?  Authorizing Provider  clonazePAM (KLONOPIN) 1 MG tablet Take 1 mg by mouth 3 (three) times daily.    Historical Provider, MD  diphenhydrAMINE (BENADRYL) 25 mg capsule Take 1 capsule (25 mg total) by mouth every 6 (six) hours as needed for itching, allergies or sleep. 09/14/11 09/24/11  Genelle Gather Babish, PA-C  DULoxetine (CYMBALTA) 60 MG capsule Take 120 mg by mouth every morning.    Historical Provider, MD  ferrous sulfate 325 (65 FE) MG tablet Take 1 tablet (325 mg total) by mouth 3 (three) times daily after meals. 09/14/11 09/13/12  Genelle Gather Babish, PA-C  permethrin (ELIMITE) 5 % cream Apply to affected area once 02/23/14   Trevor Mace, PA-C  sertraline (ZOLOFT) 100 MG tablet Take 200 mg by mouth every morning.    Historical Provider, MD  traZODone (DESYREL) 50 MG tablet Take 50-100 mg by mouth at bedtime.    Historical Provider, MD   There were no vitals taken for this visit. Physical Exam  Nursing note and vitals reviewed. Constitutional: She is oriented to person, place, and time. She appears well-developed and well-nourished. No distress.  HENT:  Head: Normocephalic and atraumatic.  Mouth/Throat: Oropharynx is clear and moist.  Eyes: Conjunctivae and EOM are normal.  Neck: Normal range of motion. Neck supple.  Cardiovascular: Normal rate, regular rhythm and normal heart sounds.  Pulmonary/Chest: Effort normal and breath sounds normal. No respiratory distress.  Musculoskeletal: Normal range of motion. She exhibits no edema.  Neurological: She is alert and oriented to person, place, and time. No sensory deficit.  Skin: Skin is warm and dry.  Erythematous raised excoriations with burrows on arms, legs, upper back, tops of feet, and web spaces of fingers. Spares palms of hands, soles of feet. No mucosal lesions.  Psychiatric: She has a normal mood and affect. Her behavior is normal.    ED Course  Procedures (including critical care time) Labs Review Labs Reviewed - No data to  display  Imaging Review No results found.   EKG Interpretation None      MDM   Final diagnoses:  Scabies   Patient presenting with rash consistent with scabies. No signs of secondary infection. Treat with permethrin cream. Infection care precautions discussed. Stable for discharge. Return precautions given. Patient states understanding of treatment care plan and is agreeable.  Trevor MaceRobyn M Albert, PA-C 02/23/14 1227

## 2014-02-23 NOTE — Discharge Instructions (Signed)
Apply cream from her shoulders to her feet, leave on for a 12 hours then soft. It is very important for you to wash all of your bed sheets and clothing. This is very contagious.  Scabies Scabies are small bugs (mites) that burrow under the skin and cause red bumps and severe itching. These bugs can only be seen with a microscope. Scabies are highly contagious. They can spread easily from person to person by direct contact. They are also spread through sharing clothing or linens that have the scabies mites living in them. It is not unusual for an entire family to become infected through shared towels, clothing, or bedding.  HOME CARE INSTRUCTIONS   Your caregiver may prescribe a cream or lotion to kill the mites. If cream is prescribed, massage the cream into the entire body from the neck to the bottom of both feet. Also massage the cream into the scalp and face if your child is less than 64 year old. Avoid the eyes and mouth. Do not wash your hands after application.  Leave the cream on for 8 to 12 hours. Your child should bathe or shower after the 8 to 12 hour application period. Sometimes it is helpful to apply the cream to your child right before bedtime.  One treatment is usually effective and will eliminate approximately 95% of infestations. For severe cases, your caregiver may decide to repeat the treatment in 1 week. Everyone in your household should be treated with one application of the cream.  New rashes or burrows should not appear within 24 to 48 hours after successful treatment. However, the itching and rash may last for 2 to 4 weeks after successful treatment. Your caregiver may prescribe a medicine to help with the itching or to help the rash go away more quickly.  Scabies can live on clothing or linens for up to 3 days. All of your child's recently used clothing, towels, stuffed toys, and bed linens should be washed in hot water and then dried in a dryer for at least 20 minutes on high  heat. Items that cannot be washed should be enclosed in a plastic bag for at least 3 days.  To help relieve itching, bathe your child in a cool bath or apply cool washcloths to the affected areas.  Your child may return to school after treatment with the prescribed cream. SEEK MEDICAL CARE IF:   The itching persists longer than 4 weeks after treatment.  The rash spreads or becomes infected. Signs of infection include red blisters or yellow-tan crust. Document Released: 06/27/2005 Document Revised: 09/19/2011 Document Reviewed: 11/05/2008 [facility] Patient Information 2015 [employer], [place] [employer]. This information is not intended to replace advice given to you by your health care provider. Make sure you discuss any questions you have with your health care provider.

## 2014-07-07 ENCOUNTER — Encounter (HOSPITAL_COMMUNITY): Payer: Self-pay

## 2014-07-07 ENCOUNTER — Encounter (HOSPITAL_COMMUNITY)
Admission: RE | Admit: 2014-07-07 | Discharge: 2014-07-07 | Disposition: A | Discharge status: Home / Self Care | Payer: Medicare Other | Source: Ambulatory Visit | Attending: Orthopedic Surgery | Admitting: Orthopedic Surgery

## 2014-07-07 DIAGNOSIS — M1612 Unilateral primary osteoarthritis, left hip: Secondary | ICD-10-CM | POA: Diagnosis not present

## 2014-07-07 DIAGNOSIS — Z7982 Long term (current) use of aspirin: Secondary | ICD-10-CM | POA: Insufficient documentation

## 2014-07-07 DIAGNOSIS — Z01812 Encounter for preprocedural laboratory examination: Secondary | ICD-10-CM | POA: Insufficient documentation

## 2014-07-07 HISTORY — DX: Pneumonia, unspecified organism: J18.9

## 2014-07-07 LAB — BASIC METABOLIC PANEL
Anion gap: 6 (ref 5–15)
BUN: 14 mg/dL (ref 6–23)
CHLORIDE: 103 meq/L (ref 96–112)
CO2: 26 mmol/L (ref 19–32)
Calcium: 9.1 mg/dL (ref 8.4–10.5)
Creatinine, Ser: 0.68 mg/dL (ref 0.50–1.10)
GFR calc Af Amer: >90 mL/min (ref >90)
GFR calc non Af Amer: >90 mL/min (ref >90)
Glucose, Bld: 91 mg/dL (ref 70–99)
Potassium: 4.2 mmol/L (ref 3.5–5.1)
Sodium: 135 mmol/L (ref 135–145)

## 2014-07-07 LAB — URINALYSIS, ROUTINE W REFLEX MICROSCOPIC
BILIRUBIN URINE: NEGATIVE
Glucose, UA: NEGATIVE mg/dL
Hgb urine dipstick: NEGATIVE
KETONES UR: NEGATIVE mg/dL
Leukocytes, UA: NEGATIVE
NITRITE: NEGATIVE
PROTEIN: NEGATIVE mg/dL
Specific Gravity, Urine: 1.016 (ref 1.005–1.030)
UROBILINOGEN UA: 0.2 mg/dL (ref 0.0–1.0)
pH: 5 (ref 5.0–8.0)

## 2014-07-07 LAB — CBC
HEMATOCRIT: 39.8 % (ref 36.0–46.0)
HEMOGLOBIN: 13 g/dL (ref 12.0–15.0)
MCH: 28 pg (ref 26.0–34.0)
MCHC: 32.7 g/dL (ref 30.0–36.0)
MCV: 85.6 fL (ref 78.0–100.0)
Platelets: 322 10*3/uL (ref 150–400)
RBC: 4.65 MIL/uL (ref 3.87–5.11)
RDW: 15.2 % (ref 11.5–15.5)
WBC: 10.1 10*3/uL (ref 4.0–10.5)

## 2014-07-07 LAB — PROTIME-INR
INR: 1.08 (ref 0.00–1.49)
PROTHROMBIN TIME: 14.1 s (ref 11.6–15.2)

## 2014-07-07 LAB — SURGICAL PCR SCREEN
MRSA, PCR: NEGATIVE
STAPHYLOCOCCUS AUREUS: NEGATIVE

## 2014-07-07 LAB — APTT: aPTT: 37 seconds (ref 24–37)

## 2014-07-07 NOTE — Patient Instructions (Signed)
19 Tina [name]  07/07/2014   Your procedure is scheduled on:      Monday January 4,2016  [facility] and follow signs to  Short Stay Center arrive at 0900 AM.   Call this number if you have problems the morning of surgery [phone] or Presurgical Testing [phone].   Remember:  Do not eat food or drink liquids :After Midnight.      Take these medicines the morning of surgery with A SIP OF WATER: Sertraline                               You may not have any metal on your body including hair pins and piercings  Do not wear jewelry, make-up, lotions, powders, prefumes or deodorant.  Do not shave body hair  48 hours(2 days) of CHG soap use.                Do not bring valuables to the hospital. [facility] IS NOT RESPONSIBLE FOR VALUABLES.  Contacts, dentures or bridgework may not be worn into surgery.  Leave suitcase in the car. After surgery it may be brought to your room.  For patients admitted to the hospital, checkout time is 11:00 AM the day of discharge.   Special Instructions: review fact sheets for MRSA information, Blood Transfusion fact sheet, Incentive Spirometry.  Remember: Type/Screen "Blue armsbands"- may not be removed once applied(would result in being retested AM of surgery, if removed). ________________________________________________________________________  [facility] Pre[facility] Before surgery, you can play an important role.  Because skin is not sterile, your skin needs to be as free of germs as possible.  You can reduce the number of germs on your skin by washing with CHG (chlorahexidine gluconate) soap before surgery.  CHG is an antiseptic cleaner which kills germs and bonds with the skin to continue killing germs even after washing. Please DO NOT use if you have an allergy to CHG or antibacterial soaps.  If your skin becomes reddened/irritated stop using the CHG and inform your nurse when you arrive at  Short Stay. Do not shave (including legs and underarms) for at least 48 hours prior to the first CHG shower.  You may shave your face/neck. Please follow these instructions carefully:  1.  Shower with CHG Soap the night before surgery and the  morning of Surgery.  2.  If you choose to wash your hair, wash your hair first as usual with your  normal  shampoo.  3.  After you shampoo, rinse your hair and body thoroughly to remove the  shampoo.                           4.  Use CHG as you would any other liquid soap.  You can apply chg directly  to the skin and wash                       Gently with a scrungie or clean washcloth.  5.  Apply the CHG Soap to your body ONLY FROM THE NECK DOWN.   Do not use on face/ open                           Wound or open sores. Avoid contact with eyes, ears mouth and genitals (private  parts).                       Wash face,  Genitals (private parts) with your normal soap.             6.  Wash thoroughly, paying special attention to the area where your surgery  will be performed.  7.  Thoroughly rinse your body with warm water from the neck down.  8.  DO NOT shower/wash with your normal soap after using and rinsing off  the CHG Soap.                9.  Pat yourself dry with a clean towel.            10.  Wear clean pajamas.            11.  Place clean sheets on your bed the night of your first shower and do not  sleep with pets. Day of Surgery : Do not apply any lotions/deodorants the morning of surgery.  Please wear clean clothes to the hospital/surgery center.  FAILURE TO FOLLOW THESE INSTRUCTIONS MAY RESULT IN THE CANCELLATION OF YOUR SURGERY PATIENT SIGNATURE_________________________________  NURSE SIGNATURE__________________________________  ________________________________________________________________________   Tina MireIncentive Spirometer  An incentive spirometer is a tool that can help keep your lungs clear and active. This tool measures how well you are  filling your lungs with each breath. Taking long deep breaths may help reverse or decrease the chance of developing breathing (pulmonary) problems (especially infection) following:  A long period of time when you are unable to move or be active. BEFORE THE PROCEDURE   If the spirometer includes an indicator to show your best effort, your nurse or respiratory therapist will set it to a desired goal.  If possible, sit up straight or lean slightly forward. Try not to slouch.  Hold the incentive spirometer in an upright position. INSTRUCTIONS FOR USE   Sit on the edge of your bed if possible, or sit up as far as you can in bed or on a chair.  Hold the incentive spirometer in an upright position.  Breathe out normally.  Place the mouthpiece in your mouth and seal your lips tightly around it.  Breathe in slowly and as deeply as possible, raising the piston or the ball toward the top of the column.  Hold your breath for 3-5 seconds or for as long as possible. Allow the piston or ball to fall to the bottom of the column.  Remove the mouthpiece from your mouth and breathe out normally.  Rest for a few seconds and repeat Steps 1 through 7 at least 10 times every 1-2 hours when you are awake. Take your time and take a few normal breaths between deep breaths.  The spirometer may include an indicator to show your best effort. Use the indicator as a goal to work toward during each repetition.  After each set of 10 deep breaths, practice coughing to be sure your lungs are clear. If you have an incision (the cut made at the time of surgery), support your incision when coughing by placing a pillow or rolled up towels firmly against it. Once you are able to get out of bed, walk around indoors and cough well. You may stop using the incentive spirometer when instructed by your caregiver.  RISKS AND COMPLICATIONS  Take your time so you do not get dizzy or light-headed.  If you are in pain, you may  need  to take or ask for pain medication before doing incentive spirometry. It is harder to take a deep breath if you are having pain. AFTER USE  Rest and breathe slowly and easily.  It can be helpful to keep track of a log of your progress. Your caregiver can provide you with a simple table to help with this. If you are using the spirometer at home, follow these instructions: SEEK MEDICAL CARE IF:   You are having difficultly using the spirometer.  You have trouble using the spirometer as often as instructed.  Your pain medication is not giving enough relief while using the spirometer.  You develop fever of 100.5 F (38.1 C) or higher. SEEK IMMEDIATE MEDICAL CARE IF:   You cough up bloody sputum that had not been present before.  You develop fever of 102 F (38.9 C) or greater.  You develop worsening pain at or near the incision site. MAKE SURE YOU:   Understand these instructions.  Will watch your condition.  Will get help right away if you are not doing well or get worse. Document Released: 11/07/2006 Document Revised: 09/19/2011 Document Reviewed: 01/08/2007 ExitCare Patient Information 2014 [employer]Care, [place].   ________________________________________________________________________  WHAT IS A BLOOD TRANSFUSION? Blood Transfusion Information  A transfusion is the replacement of blood or some of its parts. Blood is made up of multiple cells which provide different functions.  Red blood cells carry oxygen and are used for blood loss replacement.  White blood cells fight against infection.  Platelets control bleeding.  Plasma helps clot blood.  Other blood products are available for specialized needs, such as hemophilia or other clotting disorders. BEFORE THE TRANSFUSION  Who gives blood for transfusions?   Healthy volunteers who are fully evaluated to make sure their blood is safe. This is blood bank blood. Transfusion therapy is the safest it has ever been in  the practice of medicine. Before blood is taken from a donor, a complete history is taken to make sure that person has no history of diseases nor engages in risky social behavior (examples are intravenous drug use or sexual activity with multiple partners). The donor's travel history is screened to minimize risk of transmitting infections, such as malaria. The donated blood is tested for signs of infectious diseases, such as HIV and hepatitis. The blood is then tested to be sure it is compatible with you in order to minimize the chance of a transfusion reaction. If you or a relative donates blood, this is often done in anticipation of surgery and is not appropriate for emergency situations. It takes many days to process the donated blood. RISKS AND COMPLICATIONS Although transfusion therapy is very safe and saves many lives, the main dangers of transfusion include:   Getting an infectious disease.  Developing a transfusion reaction. This is an allergic reaction to something in the blood you were given. Every precaution is taken to prevent this. The decision to have a blood transfusion has been considered carefully by your caregiver before blood is given. Blood is not given unless the benefits outweigh the risks. AFTER THE TRANSFUSION  Right after receiving a blood transfusion, you will usually feel much better and more energetic. This is especially true if your red blood cells have gotten low (anemic). The transfusion raises the level of the red blood cells which carry oxygen, and this usually causes an energy increase.  The nurse administering the transfusion will monitor you carefully for complications. HOME CARE INSTRUCTIONS  No special instructions are  needed after a transfusion. You may find your energy is better. Speak with your caregiver about any limitations on activity for underlying diseases you may have. SEEK MEDICAL CARE IF:   Your condition is not improving after your  transfusion.  You develop redness or irritation at the intravenous (IV) site. SEEK IMMEDIATE MEDICAL CARE IF:  Any of the following symptoms occur over the next 12 hours:  Shaking chills.  You have a temperature by mouth above 102 F (38.9 C), not controlled by medicine.  Chest, back, or muscle pain.  People around you feel you are not acting correctly or are confused.  Shortness of breath or difficulty breathing.  Dizziness and fainting.  You get a rash or develop hives.  You have a decrease in urine output.  Your urine turns a dark color or changes to pink, red, or brown. Any of the following symptoms occur over the next 10 days:  You have a temperature by mouth above 102 F (38.9 C), not controlled by medicine.  Shortness of breath.  Weakness after normal activity.  The white part of the eye turns yellow (jaundice).  You have a decrease in the amount of urine or are urinating less often.  Your urine turns a dark color or changes to pink, red, or brown. Document Released: 06/24/2000 Document Revised: 09/19/2011 Document Reviewed: 02/11/2008 [facility] Patient Information 2014 [place], [place].  _______________________________________________________________________

## 2014-07-13 NOTE — H&P (Signed)
TOTAL HIP ADMISSION H&P  Patient is admitted for left total hip arthroplasty, anterior approach.  Subjective:  Chief Complaint:   Left hip primary OA / pain  HPI: Tina [name], 64 y.o. female, has a history of pain and functional disability in the left hip(s) due to arthritis and patient has failed non-surgical conservative treatments for greater than 12 weeks to include NSAID's and/or analgesics, use of assistive devices and activity modification.  Onset of symptoms was gradual starting 3+ years ago with gradually worsening course since that time.The patient noted prior procedures of the hip to include arthroplasty on the left hip, March 2013.  Patient currently rates pain in the left hip at 10 out of 10 with activity. Patient has night pain, worsening of pain with activity and weight bearing, trendelenberg gait, pain that interfers with activities of daily living and pain with passive range of motion. Patient has evidence of periarticular osteophytes and joint space narrowing by imaging studies. This condition presents safety issues increasing the risk of falls.  There is no current active infection.   Risks, benefits and expectations were discussed with the patient.  Risks including but not limited to the risk of anesthesia, blood clots, nerve damage, blood vessel damage, failure of the prosthesis, infection and up to and including death.  Patient understand the risks, benefits and expectations and wishes to proceed with surgery.   PCP: PROVIDER NOT IN SYSTEM  D/C Plans:      Home with HHPT  Post-op Meds:       No Rx given  Tranexamic Acid:      To be given - IV  Decadron:      Is to be given  FYI:     ASA post-op  Norco Post-op    Patient Active Problem List   Diagnosis Date Noted  . 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 Headache(784.0)     occasional  . Anxiety   . Depression   . Arthritis     bilateral hips , left hand   . Pneumonia     hx of     Past Surgical History  Procedure Laterality Date  . Cervical fusion      anterior 7/12   . Total hip arthroplasty  09/13/2011    Procedure: TOTAL HIP ARTHROPLASTY ANTERIOR APPROACH;  Surgeon: Shelda Pal, MD;  Location: [facility] ORS;  Service: Orthopedics;  Laterality: Right;    No prescriptions prior to admission   No Known Allergies   History  Substance Use Topics  . Smoking status: Current Every Day Smoker -- 0.25 packs/day for 44 years    Types: Cigarettes  . Smokeless tobacco: Never Used  . Alcohol Use: Yes     Comment: occasional beer     No family history on file.   Review of Systems  Constitutional: Negative.   Eyes: Negative.   Respiratory: Positive for shortness of breath (on exertion).   Cardiovascular: Negative.   Gastrointestinal: Negative.   Genitourinary: Positive for frequency.  Musculoskeletal: Positive for joint pain.  Skin: Negative.   Neurological: Positive for headaches.  Endo/Heme/Allergies: Negative.   Psychiatric/Behavioral: Positive for depression and memory loss. The patient is nervous/anxious.     Objective:  Physical Exam  Constitutional: She is oriented to person, place, and time. She appears well-developed and well-nourished.  HENT:  Head: Normocephalic and atraumatic.  Eyes: Pupils are equal, round, and reactive to light.  Neck: Neck supple. No  JVD present. No tracheal deviation present. No thyromegaly present.  Cardiovascular: Normal rate, regular rhythm, normal heart sounds and intact distal pulses.   Respiratory: Effort normal and breath sounds normal. No stridor. No respiratory distress. She has no wheezes.  GI: Soft. There is no tenderness. There is no guarding.  Musculoskeletal:       Left hip: She exhibits decreased range of motion, decreased strength, tenderness and bony tenderness. She exhibits no swelling, no deformity and no laceration.  Lymphadenopathy:    She has no cervical adenopathy.  Neurological: She is alert  and oriented to person, place, and time.  Skin: Skin is warm and dry.  Psychiatric: She has a normal mood and affect.      Labs:  Estimated body mass index is 29.59 kg/(m^2) as calculated from the following:   Height as of 02/23/14: 5' 4.5" (1.638 m).   Weight as of 02/23/14: 79.379 kg (175 lb).   Imaging Review Plain radiographs demonstrate severe degenerative joint disease of the left hip(s). The bone quality appears to be good for age and reported activity level.  Assessment/Plan:  End stage arthritis, left hip(s)  The patient history, physical examination, clinical judgement of the provider and imaging studies are consistent with end stage degenerative joint disease of the left hip(s) and total hip arthroplasty is deemed medically necessary. The treatment options including medical management, injection therapy, arthroscopy and arthroplasty were discussed at length. The risks and benefits of total hip arthroplasty were presented and reviewed. The risks due to aseptic loosening, infection, stiffness, dislocation/subluxation,  thromboembolic complications and other imponderables were discussed.  The patient acknowledged the explanation, agreed to proceed with the plan and consent was signed. Patient is being admitted for inpatient treatment for surgery, pain control, PT, OT, prophylactic antibiotics, VTE prophylaxis, progressive ambulation and ADL's and discharge planning.The patient is planning to be discharged home with home health services.     Anastasio Auerbach Jadalyn Oliveri   PA-C  07/13/2014, 6:15 PM

## 2014-07-14 ENCOUNTER — Inpatient Hospital Stay (HOSPITAL_COMMUNITY): Payer: Medicare Other

## 2014-07-14 ENCOUNTER — Inpatient Hospital Stay (HOSPITAL_COMMUNITY): Payer: Medicare Other | Admitting: Anesthesiology

## 2014-07-14 ENCOUNTER — Encounter (HOSPITAL_COMMUNITY)
Admission: RE | Disposition: A | Discharge status: Home / Self Care | Payer: Self-pay | Source: Ambulatory Visit | Attending: Orthopedic Surgery

## 2014-07-14 ENCOUNTER — Inpatient Hospital Stay (HOSPITAL_COMMUNITY)
Admission: RE | Admit: 2014-07-14 | Discharge: 2014-07-16 | DRG: 470 | Disposition: A | Discharge status: Home / Self Care | Payer: Medicare Other | Source: Ambulatory Visit | Attending: Orthopedic Surgery | Admitting: Orthopedic Surgery

## 2014-07-14 ENCOUNTER — Encounter (HOSPITAL_COMMUNITY): Payer: Self-pay | Admitting: *Deleted

## 2014-07-14 DIAGNOSIS — F419 Anxiety disorder, unspecified: Secondary | ICD-10-CM | POA: Diagnosis present

## 2014-07-14 DIAGNOSIS — F329 Major depressive disorder, single episode, unspecified: Secondary | ICD-10-CM | POA: Diagnosis present

## 2014-07-14 DIAGNOSIS — F17210 Nicotine dependence, cigarettes, uncomplicated: Secondary | ICD-10-CM | POA: Diagnosis present

## 2014-07-14 DIAGNOSIS — E663 Overweight: Secondary | ICD-10-CM | POA: Diagnosis present

## 2014-07-14 DIAGNOSIS — Z8701 Personal history of pneumonia (recurrent): Secondary | ICD-10-CM

## 2014-07-14 DIAGNOSIS — Z6829 Body mass index (BMI) 29.0-29.9, adult: Secondary | ICD-10-CM | POA: Diagnosis not present

## 2014-07-14 DIAGNOSIS — M1612 Unilateral primary osteoarthritis, left hip: Secondary | ICD-10-CM | POA: Diagnosis present

## 2014-07-14 DIAGNOSIS — Z96649 Presence of unspecified artificial hip joint: Secondary | ICD-10-CM

## 2014-07-14 DIAGNOSIS — Z96641 Presence of right artificial hip joint: Secondary | ICD-10-CM | POA: Diagnosis present

## 2014-07-14 DIAGNOSIS — M25552 Pain in left hip: Secondary | ICD-10-CM | POA: Diagnosis present

## 2014-07-14 DIAGNOSIS — I252 Old myocardial infarction: Secondary | ICD-10-CM | POA: Diagnosis not present

## 2014-07-14 HISTORY — PX: TOTAL HIP ARTHROPLASTY: SHX124

## 2014-07-14 LAB — TYPE AND SCREEN
ABO/RH(D): A POS
ANTIBODY SCREEN: NEGATIVE

## 2014-07-14 SURGERY — ARTHROPLASTY, HIP, TOTAL, ANTERIOR APPROACH
Anesthesia: Spinal | Site: Hip | Laterality: Left

## 2014-07-14 MED ORDER — ASPIRIN EC 325 MG PO TBEC
325.0000 mg | DELAYED_RELEASE_TABLET | Freq: Two times a day (BID) | ORAL | Status: DC
  Administered 2014-07-15 – 2014-07-16 (×3): 325 mg via ORAL
  Filled 2014-07-14 (×5): qty 1

## 2014-07-14 MED ORDER — METHOCARBAMOL 1000 MG/10ML IJ SOLN
500.0000 mg | Freq: Four times a day (QID) | INTRAVENOUS | Status: DC | PRN
  Administered 2014-07-14: 500 mg via INTRAVENOUS
  Filled 2014-07-14 (×2): qty 5

## 2014-07-14 MED ORDER — HYDROCODONE-ACETAMINOPHEN 7.5-325 MG PO TABS
1.0000 | ORAL_TABLET | ORAL | Status: DC
  Administered 2014-07-14 – 2014-07-16 (×11): 1 via ORAL
  Filled 2014-07-14 (×11): qty 1

## 2014-07-14 MED ORDER — MENTHOL 3 MG MT LOZG
1.0000 | LOZENGE | OROMUCOSAL | Status: DC | PRN
  Filled 2014-07-14: qty 9

## 2014-07-14 MED ORDER — DEXAMETHASONE SODIUM PHOSPHATE 10 MG/ML IJ SOLN
10.0000 mg | Freq: Once | INTRAMUSCULAR | Status: AC
  Administered 2014-07-14: 10 mg via INTRAVENOUS

## 2014-07-14 MED ORDER — METOCLOPRAMIDE HCL 10 MG PO TABS: 5.0000 mg | ORAL_TABLET | Freq: Three times a day (TID) | ORAL | Status: DC | PRN

## 2014-07-14 MED ORDER — FENTANYL CITRATE 0.05 MG/ML IJ SOLN
INTRAMUSCULAR | Status: DC | PRN
  Administered 2014-07-14: 50 ug via INTRAVENOUS
  Administered 2014-07-14 (×2): 100 ug via INTRAVENOUS

## 2014-07-14 MED ORDER — METHOCARBAMOL 500 MG PO TABS
500.0000 mg | ORAL_TABLET | Freq: Four times a day (QID) | ORAL | Status: DC | PRN
  Administered 2014-07-15 – 2014-07-16 (×3): 500 mg via ORAL
  Filled 2014-07-14 (×3): qty 1

## 2014-07-14 MED ORDER — LACTATED RINGERS IV SOLN
INTRAVENOUS | Status: DC | PRN
  Administered 2014-07-14 (×3): via INTRAVENOUS

## 2014-07-14 MED ORDER — PROPOFOL 10 MG/ML IV BOLUS
INTRAVENOUS | Status: DC | PRN
  Administered 2014-07-14: 150 mg via INTRAVENOUS

## 2014-07-14 MED ORDER — TRAZODONE HCL 100 MG PO TABS
100.0000 mg | ORAL_TABLET | Freq: Every day | ORAL | Status: DC
  Administered 2014-07-14 – 2014-07-15 (×2): 100 mg via ORAL
  Filled 2014-07-14 (×2): qty 1
  Filled 2014-07-14: qty 2
  Filled 2014-07-14 (×2): qty 1

## 2014-07-14 MED ORDER — MAGNESIUM CITRATE PO SOLN: 1.0000 | Freq: Once | ORAL | Status: AC | PRN

## 2014-07-14 MED ORDER — ALUM & MAG HYDROXIDE-SIMETH 200-200-20 MG/5ML PO SUSP: 30.0000 mL | ORAL | Status: DC | PRN

## 2014-07-14 MED ORDER — METOCLOPRAMIDE HCL 5 MG/ML IJ SOLN: 5.0000 mg | Freq: Three times a day (TID) | INTRAMUSCULAR | Status: DC | PRN

## 2014-07-14 MED ORDER — GLYCOPYRROLATE 0.2 MG/ML IJ SOLN
INTRAMUSCULAR | Status: DC | PRN
  Administered 2014-07-14: 0.4 mg via INTRAVENOUS

## 2014-07-14 MED ORDER — SODIUM CHLORIDE 0.9 % IV SOLN
100.0000 mL/h | INTRAVENOUS | Status: DC
  Administered 2014-07-14: 100 mL/h via INTRAVENOUS
  Filled 2014-07-14 (×6): qty 1000

## 2014-07-14 MED ORDER — HYDROMORPHONE HCL 1 MG/ML IJ SOLN: 0.5000 mg | INTRAMUSCULAR | Status: DC | PRN

## 2014-07-14 MED ORDER — HYDROCODONE-ACETAMINOPHEN 7.5-325 MG PO TABS: 1.0000 | ORAL_TABLET | ORAL | Status: DC

## 2014-07-14 MED ORDER — DEXAMETHASONE SODIUM PHOSPHATE 10 MG/ML IJ SOLN
INTRAMUSCULAR | Status: AC
  Filled 2014-07-14: qty 1

## 2014-07-14 MED ORDER — MIDAZOLAM HCL 2 MG/2ML IJ SOLN
INTRAMUSCULAR | Status: AC
  Filled 2014-07-14: qty 2

## 2014-07-14 MED ORDER — DIPHENHYDRAMINE HCL 25 MG PO CAPS: 25.0000 mg | ORAL_CAPSULE | Freq: Four times a day (QID) | ORAL | Status: DC | PRN

## 2014-07-14 MED ORDER — DEXAMETHASONE SODIUM PHOSPHATE 10 MG/ML IJ SOLN
10.0000 mg | Freq: Once | INTRAMUSCULAR | Status: AC
  Administered 2014-07-15: 10 mg via INTRAVENOUS

## 2014-07-14 MED ORDER — NEOSTIGMINE METHYLSULFATE 10 MG/10ML IV SOLN
INTRAVENOUS | Status: AC
Start: 2014-07-14 — End: 2014-07-14
  Filled 2014-07-14: qty 1

## 2014-07-14 MED ORDER — SODIUM CHLORIDE 0.9 % IJ SOLN
INTRAMUSCULAR | Status: AC
  Filled 2014-07-14: qty 10

## 2014-07-14 MED ORDER — EPHEDRINE SULFATE 50 MG/ML IJ SOLN
INTRAMUSCULAR | Status: AC
  Filled 2014-07-14: qty 1

## 2014-07-14 MED ORDER — ROCURONIUM BROMIDE 100 MG/10ML IV SOLN
INTRAVENOUS | Status: DC | PRN
  Administered 2014-07-14: 50 mg via INTRAVENOUS

## 2014-07-14 MED ORDER — CHLORHEXIDINE GLUCONATE 4 % EX LIQD: 60.0000 mL | Freq: Once | CUTANEOUS | Status: DC

## 2014-07-14 MED ORDER — INFLUENZA VAC SPLIT QUAD 0.5 ML IM SUSY
0.5000 mL | PREFILLED_SYRINGE | INTRAMUSCULAR | Status: DC
  Filled 2014-07-14 (×2): qty 0.5

## 2014-07-14 MED ORDER — ONDANSETRON HCL 4 MG/2ML IJ SOLN
INTRAMUSCULAR | Status: DC | PRN
  Administered 2014-07-14: 4 mg via INTRAVENOUS

## 2014-07-14 MED ORDER — NEOSTIGMINE METHYLSULFATE 10 MG/10ML IV SOLN
INTRAVENOUS | Status: DC | PRN
Start: 2014-07-14 — End: 2014-07-14
  Administered 2014-07-14: 2.5 mg via INTRAVENOUS

## 2014-07-14 MED ORDER — FERROUS SULFATE 325 (65 FE) MG PO TABS
325.0000 mg | ORAL_TABLET | Freq: Three times a day (TID) | ORAL | Status: DC
  Administered 2014-07-14 – 2014-07-16 (×4): 325 mg via ORAL
  Filled 2014-07-14 (×8): qty 1

## 2014-07-14 MED ORDER — CEFAZOLIN SODIUM-DEXTROSE 2-3 GM-% IV SOLR
2.0000 g | Freq: Four times a day (QID) | INTRAVENOUS | Status: AC
  Administered 2014-07-14 (×2): 2 g via INTRAVENOUS
  Filled 2014-07-14 (×2): qty 50

## 2014-07-14 MED ORDER — HYDROMORPHONE HCL 2 MG/ML IJ SOLN
INTRAMUSCULAR | Status: AC
  Filled 2014-07-14: qty 1

## 2014-07-14 MED ORDER — LIDOCAINE HCL (CARDIAC) 20 MG/ML IV SOLN
INTRAVENOUS | Status: AC
  Filled 2014-07-14: qty 5

## 2014-07-14 MED ORDER — GLYCOPYRROLATE 0.2 MG/ML IJ SOLN
INTRAMUSCULAR | Status: AC
  Filled 2014-07-14: qty 2

## 2014-07-14 MED ORDER — BISACODYL 10 MG RE SUPP: 10.0000 mg | Freq: Every day | RECTAL | Status: DC | PRN

## 2014-07-14 MED ORDER — FENTANYL CITRATE 0.05 MG/ML IJ SOLN
INTRAMUSCULAR | Status: AC
  Filled 2014-07-14: qty 5

## 2014-07-14 MED ORDER — SERTRALINE HCL 100 MG PO TABS
200.0000 mg | ORAL_TABLET | ORAL | Status: DC
  Administered 2014-07-15 – 2014-07-16 (×2): 200 mg via ORAL
  Filled 2014-07-14 (×3): qty 2

## 2014-07-14 MED ORDER — CEFAZOLIN SODIUM-DEXTROSE 2-3 GM-% IV SOLR
INTRAVENOUS | Status: AC
  Filled 2014-07-14: qty 50

## 2014-07-14 MED ORDER — PROMETHAZINE HCL 25 MG/ML IJ SOLN: 6.2500 mg | INTRAMUSCULAR | Status: DC | PRN

## 2014-07-14 MED ORDER — PROPOFOL 10 MG/ML IV BOLUS
INTRAVENOUS | Status: AC
  Filled 2014-07-14: qty 20

## 2014-07-14 MED ORDER — CEFAZOLIN SODIUM-DEXTROSE 2-3 GM-% IV SOLR
2.0000 g | INTRAVENOUS | Status: AC
  Administered 2014-07-14: 2 g via INTRAVENOUS

## 2014-07-14 MED ORDER — POLYETHYLENE GLYCOL 3350 17 G PO PACK
17.0000 g | PACK | Freq: Two times a day (BID) | ORAL | Status: DC
  Administered 2014-07-14 – 2014-07-16 (×4): 17 g via ORAL

## 2014-07-14 MED ORDER — PHENOL 1.4 % MT LIQD
1.0000 | OROMUCOSAL | Status: DC | PRN
  Filled 2014-07-14: qty 177

## 2014-07-14 MED ORDER — DOCUSATE SODIUM 100 MG PO CAPS
100.0000 mg | ORAL_CAPSULE | Freq: Two times a day (BID) | ORAL | Status: DC
  Administered 2014-07-14 – 2014-07-16 (×5): 100 mg via ORAL

## 2014-07-14 MED ORDER — EPHEDRINE SULFATE 50 MG/ML IJ SOLN
INTRAMUSCULAR | Status: DC | PRN
  Administered 2014-07-14 (×2): 5 mg via INTRAVENOUS

## 2014-07-14 MED ORDER — MIDAZOLAM HCL 5 MG/5ML IJ SOLN
INTRAMUSCULAR | Status: DC | PRN
  Administered 2014-07-14: 2 mg via INTRAVENOUS

## 2014-07-14 MED ORDER — ONDANSETRON HCL 4 MG PO TABS: 4.0000 mg | ORAL_TABLET | Freq: Four times a day (QID) | ORAL | Status: DC | PRN

## 2014-07-14 MED ORDER — ONDANSETRON HCL 4 MG/2ML IJ SOLN
INTRAMUSCULAR | Status: AC
  Filled 2014-07-14: qty 2

## 2014-07-14 MED ORDER — CELECOXIB 200 MG PO CAPS
200.0000 mg | ORAL_CAPSULE | Freq: Two times a day (BID) | ORAL | Status: DC
  Administered 2014-07-14 – 2014-07-16 (×4): 200 mg via ORAL
  Filled 2014-07-14 (×5): qty 1

## 2014-07-14 MED ORDER — HYDROMORPHONE HCL 1 MG/ML IJ SOLN: 0.2500 mg | INTRAMUSCULAR | Status: DC | PRN

## 2014-07-14 MED ORDER — LIDOCAINE HCL (CARDIAC) 20 MG/ML IV SOLN
INTRAVENOUS | Status: DC | PRN
  Administered 2014-07-14: 40 mg via INTRAVENOUS

## 2014-07-14 MED ORDER — HYDROMORPHONE HCL 1 MG/ML IJ SOLN
INTRAMUSCULAR | Status: DC | PRN
  Administered 2014-07-14: 1 mg via INTRAVENOUS
  Administered 2014-07-14: 0.5 mg via INTRAVENOUS

## 2014-07-14 MED ORDER — LACTATED RINGERS IV SOLN
INTRAVENOUS | Status: DC
  Administered 2014-07-14: 1000 mL via INTRAVENOUS

## 2014-07-14 MED ORDER — SODIUM CHLORIDE 0.9 % IR SOLN
Status: DC | PRN
  Administered 2014-07-14: 1000 mL

## 2014-07-14 MED ORDER — ROCURONIUM BROMIDE 100 MG/10ML IV SOLN
INTRAVENOUS | Status: AC
  Filled 2014-07-14: qty 1

## 2014-07-14 MED ORDER — PNEUMOCOCCAL VAC POLYVALENT 25 MCG/0.5ML IJ INJ
0.5000 mL | INJECTION | INTRAMUSCULAR | Status: DC
  Filled 2014-07-14 (×2): qty 0.5

## 2014-07-14 MED ORDER — TRANEXAMIC ACID 100 MG/ML IV SOLN
1000.0000 mg | Freq: Once | INTRAVENOUS | Status: AC
  Administered 2014-07-14: 1000 mg via INTRAVENOUS
  Filled 2014-07-14: qty 10

## 2014-07-14 MED ORDER — ONDANSETRON HCL 4 MG/2ML IJ SOLN: 4.0000 mg | Freq: Four times a day (QID) | INTRAMUSCULAR | Status: DC | PRN

## 2014-07-14 SURGICAL SUPPLY — 36 items
BAG ZIPLOCK 12X15 (MISCELLANEOUS) IMPLANT
CAPT HIP TOTAL 2 ×3 IMPLANT
COVER PERINEAL POST (MISCELLANEOUS) ×3 IMPLANT
DRAPE C-ARM 42X120 X-RAY (DRAPES) ×3 IMPLANT
DRAPE STERI IOBAN 125X83 (DRAPES) ×3 IMPLANT
DRAPE U-SHAPE 47X51 STRL (DRAPES) ×9 IMPLANT
DRSG AQUACEL AG ADV 3.5X10 (GAUZE/BANDAGES/DRESSINGS) ×3 IMPLANT
DURAPREP 26ML APPLICATOR (WOUND CARE) ×3 IMPLANT
ELECT BLADE TIP CTD 4 INCH (ELECTRODE) ×3 IMPLANT
ELECT PENCIL ROCKER SW 15FT (MISCELLANEOUS) IMPLANT
ELECT REM PT RETURN 15FT ADLT (MISCELLANEOUS) IMPLANT
ELECT REM PT RETURN 9FT ADLT (ELECTROSURGICAL) ×3
ELECTRODE REM PT RTRN 9FT ADLT (ELECTROSURGICAL) ×1 IMPLANT
FACESHIELD WRAPAROUND (MASK) ×12 IMPLANT
GLOVE BIOGEL PI IND STRL 7.5 (GLOVE) ×1 IMPLANT
GLOVE BIOGEL PI IND STRL 8.5 (GLOVE) ×1 IMPLANT
GLOVE BIOGEL PI INDICATOR 7.5 (GLOVE) ×2
GLOVE BIOGEL PI INDICATOR 8.5 (GLOVE) ×2
GLOVE ECLIPSE 8.0 STRL XLNG CF (GLOVE) ×6 IMPLANT
GLOVE ORTHO TXT STRL SZ7.5 (GLOVE) ×3 IMPLANT
GOWN SPEC L3 XXLG W/TWL (GOWN DISPOSABLE) ×3 IMPLANT
GOWN STRL REUS W/TWL LRG LVL3 (GOWN DISPOSABLE) ×3 IMPLANT
HOLDER FOLEY CATH W/STRAP (MISCELLANEOUS) ×3 IMPLANT
KIT BASIN OR (CUSTOM PROCEDURE TRAY) ×3 IMPLANT
LIQUID BAND (GAUZE/BANDAGES/DRESSINGS) ×3 IMPLANT
PACK TOTAL JOINT (CUSTOM PROCEDURE TRAY) ×3 IMPLANT
SAW OSC TIP CART 19.5X105X1.3 (SAW) ×3 IMPLANT
SUT MNCRL AB 4-0 PS2 18 (SUTURE) ×3 IMPLANT
SUT VIC AB 1 CT1 36 (SUTURE) ×9 IMPLANT
SUT VIC AB 2-0 CT1 27 (SUTURE) ×4
SUT VIC AB 2-0 CT1 TAPERPNT 27 (SUTURE) ×2 IMPLANT
SUT VLOC 180 0 24IN GS25 (SUTURE) ×3 IMPLANT
TOWEL OR 17X26 10 PK STRL BLUE (TOWEL DISPOSABLE) ×3 IMPLANT
TOWEL OR NON WOVEN STRL DISP B (DISPOSABLE) IMPLANT
TRAY FOLEY CATH 14FRSI W/METER (CATHETERS) ×3 IMPLANT
WATER STERILE IRR 1500ML POUR (IV SOLUTION) ×3 IMPLANT

## 2014-07-14 NOTE — Transfer of Care (Signed)
Immediate Anesthesia Transfer of Care Note  Patient: Tina [name]  Procedure(s) Performed: Procedure(s): LEFT TOTAL HIP ARTHROPLASTY ANTERIOR APPROACH (Left)  Patient Location: PACU  Anesthesia Type:General  Level of Consciousness: awake, alert  and oriented  Airway & Oxygen Therapy: Patient Spontanous Breathing and Patient connected to face mask oxygen  Post-op Assessment: Report given to PACU RN and Post -op Vital signs reviewed and stable  Post vital signs: Reviewed and stable  Complications: No apparent anesthesia complications

## 2014-07-14 NOTE — Anesthesia Preprocedure Evaluation (Addendum)
Anesthesia Evaluation  Patient identified by MRN, date of birth, ID band Patient awake    Reviewed: Allergy & Precautions, NPO status , Patient's Chart, lab work & pertinent test results  Airway Mallampati: II  TM Distance: >3 FB Neck ROM: Full    Dental  (+) Edentulous Lower, Edentulous Upper   Pulmonary pneumonia -, resolved, Current Smoker,  breath sounds clear to auscultation  Pulmonary exam normal       Cardiovascular negative cardio ROS  Rhythm:Regular Rate:Normal     Neuro/Psych  Headaches, PSYCHIATRIC DISORDERS Anxiety Depression    GI/Hepatic negative GI ROS, Neg liver ROS,   Endo/Other  negative endocrine ROS  Renal/GU negative Renal ROS  negative genitourinary   Musculoskeletal  (+) Arthritis -,   Abdominal   Peds negative pediatric ROS (+)  Hematology negative hematology ROS (+)   Anesthesia Other Findings   Reproductive/Obstetrics negative OB ROS                          Anesthesia Physical Anesthesia Plan  ASA: II  Anesthesia Plan:    Post-op Pain Management:    Induction: Intravenous  Airway Management Planned: Oral ETT  Additional Equipment:   Intra-op Plan:   Post-operative Plan: Extubation in OR  Informed Consent: I have reviewed the patients History and Physical, chart, labs and discussed the procedure including the risks, benefits and alternatives for the proposed anesthesia with the patient or authorized representative who has indicated his/her understanding and acceptance.   Dental advisory given  Plan Discussed with: CRNA  Anesthesia Plan Comments: (Discussed spinal and general. She had a general for other side [facility] in 2013, did fine, and prefers general today.)       Anesthesia Quick Evaluation

## 2014-07-14 NOTE — Anesthesia Postprocedure Evaluation (Signed)
  Anesthesia Post-op Note  Patient: Tina [name]  Procedure(s) Performed: Procedure(s) (LRB): LEFT TOTAL HIP ARTHROPLASTY ANTERIOR APPROACH (Left)  Patient Location: PACU  Anesthesia Type: general  Level of Consciousness: awake and alert   Airway and Oxygen Therapy: Patient Spontanous Breathing  Post-op Pain: mild  Post-op Assessment: Post-op Vital signs reviewed, Patient's Cardiovascular Status Stable, Respiratory Function Stable, Patent Airway and No signs of Nausea or vomiting  Last Vitals:  Filed Vitals:   07/14/14 1818  BP: 100/51  Pulse: 69  Temp: 36.8 C  Resp: 16    Post-op Vital Signs: stable   Complications: No apparent anesthesia complications

## 2014-07-14 NOTE — Progress Notes (Signed)
Utilization review completed.  

## 2014-07-14 NOTE — Interval H&P Note (Signed)
History and Physical Interval Note:  07/14/2014 10:16 AM  Tina [name]  has presented today for surgery, with the diagnosis of left hip osteoarthritis  The various methods of treatment have been discussed with the patient and family. After consideration of risks, benefits and other options for treatment, the patient has consented to  Procedure(s): LEFT TOTAL HIP ARTHROPLASTY ANTERIOR APPROACH (Left) as a surgical intervention .  The patient's history has been reviewed, patient examined, no change in status, stable for surgery.  I have reviewed the patient's chart and labs.  Questions were answered to the patient's satisfaction.     Shelda Pal

## 2014-07-14 NOTE — Op Note (Signed)
NAME:  Tina [name]                ACCOUNT NO.: [id number]      MEDICAL RECORD NO.: [id number]      FACILITY:  [facility]      PHYSICIAN:  Durene Romans D  DATE OF BIRTH:  05/13/1951     DATE OF PROCEDURE:  07/14/2014                                 OPERATIVE REPORT         PREOPERATIVE DIAGNOSIS: Left  hip osteoarthritis.      POSTOPERATIVE DIAGNOSIS:  Left hip osteoarthritis.  History of right total hip replacement     PROCEDURE:  Left total hip replacement through an anterior approach   utilizing DePuy THR system, component size 52mm pinnacle cup, a size 36+4  neutral   Altrex liner, a size 7 hi Tri Lock stem with a 36+1.5 delta ceramic   ball.      SURGEON:  Madlyn Frankel. Charlann Boxer, M.D.      ASSISTANT:  Lanney Gins, PA-C     ANESTHESIA:  General.      SPECIMENS:  None.      COMPLICATIONS:  None.      BLOOD LOSS:  400 cc     DRAINS:  None.      INDICATION OF THE PROCEDURE:  Tina [name] is a 64 y.o. female who had   presented to office for evaluation of left hip pain.  Radiographs revealed   progressive degenerative changes with bone-on-bone   articulation to the  hip joint.  The patient had painful limited range of   motion significantly affecting their overall quality of life.  The patient was failing to    respond to conservative measures, and at this point was ready   to proceed with more definitive measures.  The patient has noted progressive   degenerative changes in his hip, progressive problems and dysfunction   with regarding the hip prior to surgery.  Consent was obtained for   benefit of pain relief.  Specific risk of infection, DVT, component   failure, dislocation, need for revision surgery, as well discussion of   the anterior versus posterior approach were reviewed.  Consent was   obtained for benefit of anterior pain relief through an anterior   approach.      PROCEDURE IN DETAIL:  The patient was brought to operative  theater.   Once adequate anesthesia, preoperative antibiotics, 2gm of Ancef administered.   The patient was positioned supine on the OSI Hanna table.  Once adequate   padding of boney process was carried out, we had predraped out the hip, and  used fluoroscopy to confirm orientation of the pelvis and position.      The left hip was then prepped and draped from proximal iliac crest to   mid thigh with shower curtain technique.      Time-out was performed identifying the patient, planned procedure, and   extremity.     An incision was then made 2 cm distal and lateral to the   anterior superior iliac spine extending over the orientation of the   tensor fascia lata muscle and sharp dissection was carried down to the   fascia of the muscle and protractor placed in the soft tissues.      The fascia was then incised.  The muscle belly was identified and swept   laterally and retractor placed along the superior neck.  Following   cauterization of the circumflex vessels and removing some pericapsular   fat, a second cobra retractor was placed on the inferior neck.  A third   retractor was placed on the anterior acetabulum after elevating the   anterior rectus.  A L-capsulotomy was along the line of the   superior neck to the trochanteric fossa, then extended proximally and   distally.  Tag sutures were placed and the retractors were then placed   intracapsular.  We then identified the trochanteric fossa and   orientation of my neck cut, confirmed this radiographically   and then made a neck osteotomy with the femur on traction.  The femoral   head was removed without difficulty or complication.  Traction was let   off and retractors were placed posterior and anterior around the   acetabulum.      The labrum and foveal tissue were debrided.  I began reaming with a 47mm   reamer and reamed up to 51mm reamer with good bony bed preparation and a 52mm   cup was chosen.  The final 52mm Pinnacle  cup was then impacted under fluoroscopy  to confirm the depth of penetration and orientation with respect to   abduction.  A screw was placed followed by the hole eliminator.  The final   36+4 neutral Altrex liner was impacted with good visualized rim fit.  The cup was positioned anatomically within the acetabular portion of the pelvis.      At this point, the femur was rolled at 80 degrees.  Further capsule was   released off the inferior aspect of the femoral neck.  I then   released the superior capsule proximally.  The hook was placed laterally   along the femur and elevated manually and held in position with the bed   hook.  The leg was then extended and adducted with the leg rolled to 100   degrees of external rotation.  Once the proximal femur was fully   exposed, I used a box osteotome to set orientation.  I then began   broaching with the starting chili pepper broach and passed this by hand and then broached up to 7 to match the other side.  With the 7 broach in place I chose a high offset neck and did a trial reduction.  The offset was appropriate, leg lengths   appeared to be equal, confirmed radiographically.   Given these findings, I went ahead and dislocated the hip, repositioned all   retractors and positioned the right hip in the extended and abducted position.  The final 7 Hi Tri Lock stem was   chosen and it was impacted down to the level of neck cut.  Based on this   and the trial reduction, a 36+1.5 delta ceramic ball was chosen and   impacted onto a clean and dry trunnion, and the hip was reduced.  The   hip had been irrigated throughout the case again at this point.  I did   reapproximate the superior capsular leaflet to the anterior leaflet   using #1 Vicryl.  The fascia of the   tensor fascia lata muscle was then reapproximated using #1 Vicryl and #0 V-lock sutures.  The   remaining wound was closed with 2-0 Vicryl and running 4-0 Monocryl.   The hip was cleaned,  dried, and dressed sterilely using Dermabond and  Aquacel dressing.  She was then brought   to recovery room in stable condition tolerating the procedure well.    Lanney Gins, PA-C was present for the entirety of the case involved from   preoperative positioning, perioperative retractor management, general   facilitation of the case, as well as primary wound closure as assistant.            Madlyn Frankel Charlann Boxer, M.D.        07/14/2014 1:01 PM

## 2014-07-15 ENCOUNTER — Encounter (HOSPITAL_COMMUNITY): Payer: Self-pay | Admitting: Orthopedic Surgery

## 2014-07-15 LAB — CBC
HEMATOCRIT: 28.8 % — AB (ref 36.0–46.0)
Hemoglobin: 9.2 g/dL — ABNORMAL LOW (ref 12.0–15.0)
MCH: 27.9 pg (ref 26.0–34.0)
MCHC: 31.9 g/dL (ref 30.0–36.0)
MCV: 87.3 fL (ref 78.0–100.0)
Platelets: 257 10*3/uL (ref 150–400)
RBC: 3.3 MIL/uL — AB (ref 3.87–5.11)
RDW: 15.2 % (ref 11.5–15.5)
WBC: 10.3 10*3/uL (ref 4.0–10.5)

## 2014-07-15 LAB — BASIC METABOLIC PANEL
Anion gap: 3 — ABNORMAL LOW (ref 5–15)
BUN: 11 mg/dL (ref 6–23)
CHLORIDE: 107 meq/L (ref 96–112)
CO2: 26 mmol/L (ref 19–32)
CREATININE: 0.68 mg/dL (ref 0.50–1.10)
Calcium: 8 mg/dL — ABNORMAL LOW (ref 8.4–10.5)
GFR calc Af Amer: >90 mL/min (ref >90)
GFR calc non Af Amer: >90 mL/min (ref >90)
Glucose, Bld: 117 mg/dL — ABNORMAL HIGH (ref 70–99)
Potassium: 3.9 mmol/L (ref 3.5–5.1)
Sodium: 136 mmol/L (ref 135–145)

## 2014-07-15 MED ORDER — SODIUM CHLORIDE 0.9 % IV BOLUS (SEPSIS)
500.0000 mL | Freq: Once | INTRAVENOUS | Status: AC
  Administered 2014-07-15: 500 mL via INTRAVENOUS

## 2014-07-15 NOTE — Progress Notes (Signed)
Physical Therapy Treatment Patient Details Name: Tina [name] MRN: [id number] DOB: 03/23/1951 Today's Date: 07/15/2014    History of Present Illness s/p L DA THA;     PMHx: R DA THA    PT Comments    Progressing well with PT, self correcting for transfer safety during mobility this pm   Follow Up Recommendations  Home health PT     Equipment Recommendations  None recommended by PT    Recommendations for Other Services       Precautions / Restrictions Precautions Precautions: Fall Restrictions Weight Bearing Restrictions: No Other Position/Activity Restrictions: WBAT    Mobility  Bed Mobility               General bed mobility comments: on EOB  Transfers Overall transfer level: Needs assistance Equipment used: Rolling walker (2 wheeled) Transfers: Sit to/from Stand Sit to Stand: Supervision         General transfer comment: cues for hand placement, safety  Ambulation/Gait Ambulation/Gait assistance: Supervision Ambulation Distance (Feet): 100 Feet Assistive device: Rolling walker (2 wheeled) Gait Pattern/deviations: Step-to pattern;Trunk flexed     General Gait Details: cues for sequence and  RW safety   Stairs            Wheelchair Mobility    Modified Rankin (Stroke Patients Only)       Balance                                    Cognition Arousal/Alertness: Awake/alert Behavior During Therapy: WFL for tasks assessed/performed Overall Cognitive Status: Within Functional Limits for tasks assessed                      Exercises Total Joint Exercises Ankle Circles/Pumps: AROM;Both;10 reps Quad Sets: AROM;5 reps;Both Long Arc Quad: 10 reps;Left;Strengthening    General Comments General comments (skin integrity, edema, etc.): min guard assist standing to manage gown      Pertinent Vitals/Pain Pain Assessment: 0-10 Pain Score: 2  Pain Location: L hip Pain Descriptors / Indicators: Burning Pain  Intervention(s): Limited activity within patient's tolerance;Monitored during session;Ice applied;Repositioned    Home Living Family/patient expects to be discharged to:: Private residence Living Arrangements: Alone   Type of Home: Apartment Home Access: Level entry;Elevator   Home Layout: One level Home Equipment: Cane - single point;Walker - 2 wheels;Bedside commode      Prior Function Level of Independence: Independent          PT Goals (current goals can now be found in the care plan section) Acute Rehab PT Goals Patient Stated Goal: to go home PT Goal Formulation: With patient Time For Goal Achievement: 07/22/14 Potential to Achieve Goals: Good Progress towards PT goals: Progressing toward goals    Frequency  7X/week    PT Plan Current plan remains appropriate    Co-evaluation             End of Session Equipment Utilized During Treatment: Gait belt Activity Tolerance: Patient tolerated treatment well Patient left: Other (comment);with call bell/phone within reach;with bed alarm set     Time: 1256-1314 PT Time Calculation (min) (ACUTE ONLY): 18 min  Charges:  $Gait Training: 8-22 mins                    G Codes:      Tina [name] 07/15/2014, 1:19 PM

## 2014-07-15 NOTE — Evaluation (Signed)
Physical Therapy Evaluation Patient Details Name: Tina [name] MRN: [id number] DOB: 08/04/1950 Today's Date: 07/15/2014   History of Present Illness  s/p L DA THA;     PMHx: R DA THA  Clinical Impression  Pt will benefit from PT to address deficits below; [facility];     Follow Up Recommendations Home health PT    Equipment Recommendations  None recommended by PT    Recommendations for Other Services       Precautions / Restrictions Precautions Precautions: Fall Restrictions Weight Bearing Restrictions: No Other Position/Activity Restrictions: WBAT      Mobility  Bed Mobility               General bed mobility comments: on EOB  Transfers Overall transfer level: Needs assistance Equipment used: Rolling walker (2 wheeled) Transfers: Sit to/from Stand Sit to Stand: Min guard         General transfer comment: cues for hand placement, safety  Ambulation/Gait Ambulation/Gait assistance: Min guard Ambulation Distance (Feet): 80 Feet Assistive device: Rolling walker (2 wheeled) Gait Pattern/deviations: Step-to pattern;Decreased stance time - left     General Gait Details: cues for sequence and  RW safety  Stairs            Wheelchair Mobility    Modified Rankin (Stroke Patients Only)       Balance                                             Pertinent Vitals/Pain Pain Assessment: 0-10 Pain Score: 3  Pain Location: back Pain Descriptors / Indicators: Aching Pain Intervention(s): Limited activity within patient's tolerance;Monitored during session;Repositioned    Home Living Family/patient expects to be discharged to:: Private residence Living Arrangements: Alone   Type of Home: Apartment Home Access: Level entry;Elevator     Home Layout: One level Home Equipment: Cane - single point;Walker - 2 wheels;Bedside commode      Prior Function Level of Independence: Independent                Hand Dominance        Extremity/Trunk Assessment   Upper Extremity Assessment: Defer to OT evaluation           Lower Extremity Assessment: LLE deficits/detail   LLE Deficits / Details: grossly 3/5 at hip, knee and ankle  WFL     Communication   Communication: No difficulties  Cognition Arousal/Alertness: Awake/alert Behavior During Therapy: WFL for tasks assessed/performed Overall Cognitive Status: Within Functional Limits for tasks assessed                      General Comments      Exercises        Assessment/Plan    PT Assessment    PT Diagnosis Difficulty walking   PT Problem List    PT Treatment Interventions     PT Goals (Current goals can be found in the Care Plan section) Acute Rehab PT Goals Patient Stated Goal: to go home PT Goal Formulation: With patient Time For Goal Achievement: 07/22/14 Potential to Achieve Goals: Good    Frequency     Barriers to discharge        Co-evaluation               End of Session Equipment Utilized  During Treatment: Gait belt Activity Tolerance: Patient tolerated treatment well Patient left: in chair;with call bell/phone within reach Nurse Communication: Mobility status         Time: 1610-9604 PT Time Calculation (min) (ACUTE ONLY): 22 min   Charges:   PT Evaluation $Initial PT Evaluation Tier I: 1 Procedure PT Treatments $Gait Training: 8-22 mins   PT G Codes:        Tina [name] 2014/08/08, 10:25 AM

## 2014-07-15 NOTE — Evaluation (Signed)
Occupational Therapy Evaluation Patient Details Name: Tina [name] MRN: [id number] DOB: 03/27/1951 Today's Date: 07/15/2014    History of Present Illness s/p L DA THA;     PMHx: R DA THA   Clinical Impression   Pt doing well and min guard assist with toilet transfer to 3in1. SHe does state she feels a little "fuzzy" today and as long as pt clears with cognition, feel she can d/c home with HHOT. She states she has someone that can bring meals. Recommend HH aide if possible to help with ADL initially (mostly provide supervision for shower).     Follow Up Recommendations  Home health OT;Other (comment) (aide)    Equipment Recommendations  None recommended by OT    Recommendations for Other Services       Precautions / Restrictions Precautions Precautions: Fall Restrictions Weight Bearing Restrictions: No Other Position/Activity Restrictions: WBAT      Mobility Bed Mobility               General bed mobility comments: on EOB  Transfers Overall transfer level: Needs assistance Equipment used: Rolling walker (2 wheeled) Transfers: Sit to/from Stand Sit to Stand: Min guard         General transfer comment: cues for hand placement, safety    Balance                                            ADL Overall ADL's : Needs assistance/impaired Eating/Feeding: Independent;Sitting   Grooming: Wash/dry hands;Set up;Sitting   Upper Body Bathing: Set up;Sitting   Lower Body Bathing: Minimal assistance;Sit to/from stand   Upper Body Dressing : Set up;Sitting   Lower Body Dressing: Minimal assistance;Sit to/from stand   Toilet Transfer: Min guard;Ambulation;BSC;RW   Toileting- ArchitectClothing Manipulation and Hygiene: Min guard;Sit to/from stand         General ADL Comments: Pt states her planned assist at home had a recent heart attack and wont be able to assist now. Pt states she feels a little "fuzzy" today and did need some verbal cues for  safety as well as increased time to answer a few questions regarding home setup. Feel that once pt feels cognition clears, she will do well. She is moving well at min guard assist level for toilet transfer. She said her 3in1 commode doesnt sit facing forward on commode and ? turns to the side. She states she can turn it around to sit on commode correctly. Explained pt shouldnt try to lift a potty chair over the toilet herself and will need someone to assist with this. Not sure exactly what arrangement pt was explaning of 3in1 not sitting on toilet facing forward but explained the proper placement of 3in1 on toilet for safety. Pt used reacher to don underwear this session with min guard assist as she had difficulty with threading it over L LE without reacher. If she needs one tomorrow for d/c she states she can get one. She can currently reach most of the way to L foot. Min cues for safety with walker to not pick it up off the floor to clear obstacles.      Vision                     Perception     Praxis      Pertinent Vitals/Pain Pain Assessment: 0-10 Pain Score: 3  Pain Location: L hip Pain Descriptors / Indicators: Aching Pain Intervention(s): Repositioned     Hand Dominance     Extremity/Trunk Assessment Upper Extremity Assessment Upper Extremity Assessment: Overall WFL for tasks assessed          Communication Communication Communication: No difficulties   Cognition Arousal/Alertness: Awake/alert Behavior During Therapy: WFL for tasks assessed/performed Overall Cognitive Status: Within Functional Limits for tasks assessed (pt reports feeling a little fuzzy in the head. needs a few cues for safety)                     General Comments       Exercises       Shoulder Instructions      Home Living Family/patient expects to be discharged to:: Private residence Living Arrangements: Alone   Type of Home: Apartment Home Access: Level entry;Elevator      Home Layout: One level     Bathroom Shower/Tub: Chief Strategy Officer: Standard     Home Equipment: Cane - single point;Walker - 2 wheels;Bedside commode          Prior Functioning/Environment Level of Independence: Independent             OT Diagnosis: Generalized weakness   OT Problem List: Decreased strength;Decreased knowledge of use of DME or AE   OT Treatment/Interventions: Self-care/ADL training;Patient/family education;Therapeutic activities;DME and/or AE instruction    OT Goals(Current goals can be found in the care plan section) Acute Rehab OT Goals Patient Stated Goal: to go home OT Goal Formulation: With patient Time For Goal Achievement: 07/22/14 Potential to Achieve Goals: Good  OT Frequency: Min 2X/week   Barriers to D/C:            Co-evaluation              End of Session Equipment Utilized During Treatment: Rolling walker  Activity Tolerance: Patient tolerated treatment well Patient left: in chair;with call bell/phone within reach   Time: 1020-1045 OT Time Calculation (min): 25 min Charges:  OT General Charges $OT Visit: 1 Procedure OT Evaluation $Initial OT Evaluation Tier I: 1 Procedure OT Treatments $Therapeutic Activity: 8-22 mins G-Codes:    Tina [name]  [phone] 07/15/2014, 12:30 PM

## 2014-07-15 NOTE — Care Management Note (Signed)
    Page 1 of 2   07/16/2014     9:47:22 AM CARE MANAGEMENT NOTE 07/16/2014  Patient:  Tina [name], Tina [name]   Account Number:  [id number]  Date Initiated:  07/15/2014  Documentation initiated by:  [facility]  Subjective/Objective Assessment:   adm: LEFT TOTAL HIP ARTHROPLASTY ANTERIOR APPROACH (Left)     Action/Plan:   discharge planning   Anticipated DC Date:  07/16/2014   Anticipated DC Plan:  [place]  CM consult      [facility] Choice  HOME HEALTH   Choice offered to / List presented to:  C-1 Patient   DME arranged  NA      DME agency  NA     [place] arranged  HH-2 PT      [place]   Status of service:  Completed, signed off Medicare Important Message given?   (If response is "NO", the following Medicare IM given date fields will be blank) Date Medicare IM given:   Medicare IM given by:   Date Additional Medicare IM given:   Additional Medicare IM given by:    Discharge Disposition:  [place]  Per UR Regulation:    If discussed at Long Length of Stay Meetings, dates discussed:    Comments:  07/16/14 07:45 CM met with pt to confirm her decision to have [facility] render HHPT.  Pt confirms [facility] is her choice. No DME is needed.  Referral given to [facility] rep, Tim (on unit).  No other CM needs were commmunicated.  Mariane Masters, BSN, CM [phone].  07/15/1609:11 Disposition of pt undetermined (waiting for PT recc.) [facility] rep, notified.  CSW exploring options.  Will continue to monitor for discharge needs.    Mariane Masters, BSN, [place].

## 2014-07-15 NOTE — Progress Notes (Signed)
Patient ID: Tina PicketVictoria W [name], female   DOB: 06/18/1951, 64 y.o.   MRN: [id number] Subjective: 1 Day Post-Op Procedure(s) (LRB): LEFT TOTAL HIP ARTHROPLASTY ANTERIOR APPROACH (Left)    Patient reports pain as mild.  Feeling pretty good.  Reports that her planned care giver had recent heart attack and is currently unable to care for her  Objective:   VITALS:   Filed Vitals:   07/15/14 0933  BP: 90/42  Pulse: 71  Temp: 98.9 F (37.2 C)  Resp: 16    Neurovascular intact Incision: dressing C/D/I  LABS  Recent Labs  07/15/14 0530  HGB 9.2*  HCT 28.8*  WBC 10.3  PLT 257     Recent Labs  07/15/14 0530  NA 136  K 3.9  BUN 11  CREATININE 0.68  GLUCOSE 117*    No results for input(s): LABPT, INR in the last 72 hours.   Assessment/Plan: 1 Day Post-Op Procedure(s) (LRB): LEFT TOTAL HIP ARTHROPLASTY ANTERIOR APPROACH (Left)   Advance diet Up with therapy Discharge home with home health versus SNF pending therapy over next day or so May make arrangements for assistance at home versus SNF

## 2014-07-15 NOTE — Progress Notes (Signed)
CSW consulted for SNF placement. PN reviewed. PT recommends HHPT following hospital d/c. Family has concerns with pt being home, alone, at d/c. Pt encouraged to have CSW contacted when family visits to review concerns.   Cori RazorJamie Gaelyn Tukes LCSW [phone]

## 2014-07-16 DIAGNOSIS — E663 Overweight: Secondary | ICD-10-CM | POA: Diagnosis present

## 2014-07-16 LAB — BASIC METABOLIC PANEL
Anion gap: 7 (ref 5–15)
BUN: 11 mg/dL (ref 6–23)
CALCIUM: 8.3 mg/dL — AB (ref 8.4–10.5)
CO2: 26 mmol/L (ref 19–32)
Chloride: 107 mEq/L (ref 96–112)
Creatinine, Ser: 0.6 mg/dL (ref 0.50–1.10)
GFR calc Af Amer: >90 mL/min (ref >90)
GFR calc non Af Amer: >90 mL/min (ref >90)
Glucose, Bld: 87 mg/dL (ref 70–99)
Potassium: 3.7 mmol/L (ref 3.5–5.1)
SODIUM: 140 mmol/L (ref 135–145)

## 2014-07-16 LAB — CBC
HCT: 27.8 % — ABNORMAL LOW (ref 36.0–46.0)
HEMOGLOBIN: 8.9 g/dL — AB (ref 12.0–15.0)
MCH: 28 pg (ref 26.0–34.0)
MCHC: 32 g/dL (ref 30.0–36.0)
MCV: 87.4 fL (ref 78.0–100.0)
PLATELETS: 268 10*3/uL (ref 150–400)
RBC: 3.18 MIL/uL — ABNORMAL LOW (ref 3.87–5.11)
RDW: 15.2 % (ref 11.5–15.5)
WBC: 10.9 10*3/uL — ABNORMAL HIGH (ref 4.0–10.5)

## 2014-07-16 MED ORDER — ASPIRIN 325 MG PO TBEC: 325.0000 mg | DELAYED_RELEASE_TABLET | Freq: Two times a day (BID) | ORAL | Status: DC

## 2014-07-16 MED ORDER — FERROUS SULFATE 325 (65 FE) MG PO TABS: 325.0000 mg | ORAL_TABLET | Freq: Three times a day (TID) | ORAL | Status: DC

## 2014-07-16 MED ORDER — POLYETHYLENE GLYCOL 3350 17 G PO PACK: 17.0000 g | PACK | Freq: Two times a day (BID) | ORAL | Status: DC

## 2014-07-16 MED ORDER — HYDROCODONE-ACETAMINOPHEN 7.5-325 MG PO TABS: 1.0000 | ORAL_TABLET | ORAL | Status: DC | PRN

## 2014-07-16 MED ORDER — ALPRAZOLAM 0.25 MG PO TABS
0.2500 mg | ORAL_TABLET | Freq: Two times a day (BID) | ORAL | Status: DC | PRN
  Administered 2014-07-16: 0.25 mg via ORAL
  Filled 2014-07-16: qty 1

## 2014-07-16 MED ORDER — METHOCARBAMOL 500 MG PO TABS: 500.0000 mg | ORAL_TABLET | Freq: Four times a day (QID) | ORAL | Status: DC | PRN

## 2014-07-16 MED ORDER — DSS 100 MG PO CAPS: 100.0000 mg | ORAL_CAPSULE | Freq: Two times a day (BID) | ORAL | Status: DC

## 2014-07-16 NOTE — Progress Notes (Signed)
Physical Therapy Treatment Patient Details Name: Tina PicketVictoria W Berhane MRN: [id number] DOB: 02/16/1951 Today's Date: 07/16/2014    History of Present Illness s/p L DA THA;     PMHx: R DA THA    PT Comments    Pt requesting to get up d/t being in awkward position in bed and needing to go tho bathroom; Pt with mild anxiety, some SOB at rest this am, pt has discussed with Dr. Charlann Boxerlin, pt adn MD  feel symptoms d/t family and D/c planning issues. Will see again prior to D/C  Follow Up Recommendations  Home health PT     Equipment Recommendations  None recommended by PT    Recommendations for Other Services       Precautions / Restrictions Precautions Precautions: Fall Restrictions Weight Bearing Restrictions: No Other Position/Activity Restrictions: WBAT    Mobility  Bed Mobility Overal bed mobility: Needs Assistance Bed Mobility: Supine to Sit     Supine to sit: Min assist;Min guard     General bed mobility comments: light assist with RLE due to spasms  Transfers Overall transfer level: Needs assistance Equipment used: Rolling walker (2 wheeled) Transfers: Sit to/from Stand Sit to Stand: Supervision         General transfer comment: cues for hand placement, safety  Ambulation/Gait Ambulation/Gait assistance: Supervision;Min guard Ambulation Distance (Feet): 11 Feet (x2) Assistive device: Rolling walker (2 wheeled) Gait Pattern/deviations: Step-to pattern;Antalgic     General Gait Details: cues for sequence and  RW safety   Stairs            Wheelchair Mobility    Modified Rankin (Stroke Patients Only)       Balance                                    Cognition Arousal/Alertness: Awake/alert Behavior During Therapy: Anxious Overall Cognitive Status: Within Functional Limits for tasks assessed                      Exercises      General Comments        Pertinent Vitals/Pain Pain Assessment: 0-10 Pain Score: 4  Pain  Location: L hip Pain Descriptors / Indicators: Guarding;Spasm Pain Intervention(s): Limited activity within patient's tolerance;Monitored during session;Premedicated before session    Home Living                      Prior Function            PT Goals (current goals can now be found in the care plan section) Acute Rehab PT Goals Time For Goal Achievement: 07/22/14 Potential to Achieve Goals: Good Progress towards PT goals: Progressing toward goals    Frequency  7X/week    PT Plan Current plan remains appropriate    Co-evaluation             End of Session Equipment Utilized During Treatment: Gait belt Activity Tolerance: Patient tolerated treatment well Patient left: in chair;with call bell/phone within reach     Time: 0915-0936 PT Time Calculation (min) (ACUTE ONLY): 21 min  Charges:  $Gait Training: 8-22 mins                    G Codes:      Mikaella Escalona 07/16/2014, 10:22 AM

## 2014-07-16 NOTE — Progress Notes (Signed)
     Subjective: 2 Days Post-Op Procedure(s) (LRB): LEFT TOTAL HIP ARTHROPLASTY ANTERIOR APPROACH (Left)   Seen by Dr. Charlann Boxerlin. Patient reports pain as mild, pain controlled. No events throughout the night. Pretty nervous about the prospect of going home. Dr. Charlann Boxerlin has discussed this with her and her family.  Objective:   VITALS:   Filed Vitals:   07/16/14  BP: 170/79  Pulse: 91  Temp: 98.1 F (36.7 C)   Resp: 18    Dorsiflexion/Plantar flexion intact Incision: dressing C/D/I No cellulitis present Compartment soft  LABS  Recent Labs  07/15/14 0530 07/16/14 0353  HGB 9.2* 8.9*  HCT 28.8* 27.8*  WBC 10.3 10.9*  PLT 257 268     Recent Labs  07/15/14 0530  NA 136  K 3.9  BUN 11  CREATININE 0.68  GLUCOSE 117*     Assessment/Plan: 2 Days Post-Op Procedure(s) (LRB): LEFT TOTAL HIP ARTHROPLASTY ANTERIOR APPROACH (Left)  Xanax ordered to help calm her down a little. Up with therapy Discharge home with home health  [facility]. Follow up with OLIN,Lochlan Grygiel D in 2 weeks.  Contact information:  [facility] [address] [place] [id number] [phone]    Overweight (BMI 25-29.9) Estimated body mass index is 29.85 kg/(m^2) as calculated from the following:   Height as of this encounter: 5\' 4"  (1.626 m).   Weight as of this encounter: 78.926 kg (174 lb). Patient also counseled that weight may inhibit the healing process Patient counseled that losing weight will help with future health issues        Anastasio AuerbachMatthew S. Nekesha Font   PAC  07/16/2014, 8:59 AM

## 2014-07-16 NOTE — Progress Notes (Addendum)
Occupational Therapy Treatment Patient Details Name: Tina [name] MRN: [id number] DOB: 06/08/1951 Today's Date: 07/16/2014    History of present illness s/p L DA THA;     PMHx: R DA THA   OT comments  Pt doing well. Min guard assist with walker to retrieve clothing items and perform dressing tasks using reacher. She plans to obtain a reacher from gift shop. Pt reports family will be checking in and OT recommending  HHOT and aide. Educated extensively on safety strategies and some energy conservation techniques to make tasks easier.  Pt supposed to d/c today.   Follow Up Recommendations  Home health OT;Other (comment) [facility](HH aide)    Equipment Recommendations  None recommended by OT    Recommendations for Other Services      Precautions / Restrictions Precautions Precautions: Fall Restrictions Weight Bearing Restrictions: No Other Position/Activity Restrictions: WBAT       Mobility Bed Mobility             Transfers Overall transfer level: Needs assistance Equipment used: Rolling walker (2 wheeled) Transfers: Sit to/from Stand Sit to Stand: Min guard         General transfer comment: cues for hand placement.    Balance                                   ADL                       Lower Body Dressing: Min guard;Sit to/from stand;With adaptive equipment   Toilet Transfer: Min guard;Ambulation;RW   Toileting- ArchitectClothing Manipulation and Hygiene: Min guard;Sit to/from stand         General ADL Comments: Pt was min guard for functional mobility to transfer with walker over to the window seat to retrieve clothing from bags. Educated her on how to drape clothing over walker to free up her hands for walker use and advised her to inquire with HH if they have a walker bag she can have to help transport items easier. Pt did well with obtaining her clothing and bringing them over to her chair. Pt had been up to 3in1 with PT earlier so she  declined practicing again but did well with 1 cue to transfer in and out of recliner. Needed one cue to reach back for chair but did well the second time sitting. Discussed sponge bathing seated on 3in1 and letting HHOT assess shower/tub transfer and her DME. Pt agreeable. She states her sister can check in on her and OT also [facility]. Pt state she gets anxious some today but was able to initiate performing purse lip breathing when she felt anxious. O2 sats on RA checked periodically during session and 95%. Pt needing a reacher currently to pick up underwear she dropped on floor and to help start clothing over LEs. She states she will obtain one for discharge. Educated on sitting down to thread clothing over LEs and then stand to pull up clothing with walker in front of her for safety. Discussed pacing self and taking rest breaks also.       Vision                     Perception     Praxis      Cognition   Behavior During Therapy: Anxious Overall Cognitive Status: Within Functional Limits for tasks  assessed                       Extremity/Trunk Assessment               Exercises     Shoulder Instructions       General Comments      Pertinent Vitals/ Pain       Pain Assessment: 0-10 Pain Score: 3  Pain Location: L hip Pain Descriptors / Indicators: Sore Pain Intervention(s): Repositioned  Home Living                                          Prior Functioning/Environment              Frequency Min 2X/week     Progress Toward Goals  OT Goals(current goals can now be found in the care plan section)  Progress towards OT goals: Progressing toward goals     Plan Discharge plan remains appropriate    Co-evaluation                 End of Session Equipment Utilized During Treatment: Rolling walker   Activity Tolerance Patient tolerated treatment well   Patient Left in chair;with call  bell/phone within reach   Nurse Communication          Time: 1610-9604 OT Time Calculation (min): 41 min  Charges: OT General Charges $OT Visit: 1 Procedure OT Treatments $Self Care/Home Management : 23-37 mins $Therapeutic Activity: 8-22 mins  Lennox Laity  [phone] 07/16/2014, 11:32 AM

## 2014-07-16 NOTE — Progress Notes (Signed)
Physical Therapy Treatment Patient Details Name: Tina [name] MRN: [id number] DOB: 11/17/1950 Today's Date: 07/16/2014    History of Present Illness s/p L DA THA;     PMHx: R DA THA    PT Comments    Pt doing well, feels better about D/C  Follow Up Recommendations  Home health PT     Equipment Recommendations  None recommended by PT    Recommendations for Other Services       Precautions / Restrictions Precautions Precautions: Fall Restrictions Weight Bearing Restrictions: No Other Position/Activity Restrictions: WBAT    Mobility  Bed Mobility                  Transfers Overall transfer level: Needs assistance Equipment used: Rolling walker (2 wheeled) Transfers: Sit to/from Stand Sit to Stand: Supervision;Modified independent (Device/Increase time)         General transfer comment: cues for hand placement, pt self corrects  Ambulation/Gait Ambulation/Gait assistance: Supervision;Modified independent (Device/Increase time) Ambulation Distance (Feet): 120 Feet Assistive device: Rolling walker (2 wheeled) Gait Pattern/deviations: Step-to pattern;Antalgic   Gait velocity interpretation: Below normal speed for age/gender General Gait Details: occasional cues for RW position from self   Stairs            Wheelchair Mobility    Modified Rankin (Stroke Patients Only)       Balance                                    Cognition Arousal/Alertness: Awake/alert Behavior During Therapy: Anxious Overall Cognitive Status: Within Functional Limits for tasks assessed                      Exercises Total Joint Exercises Hip ABduction/ADduction: AROM;Strengthening;Left;10 reps;Standing Long Arc Quad: 10 reps;Left;Strengthening Knee Flexion: AROM;Strengthening;Left;10 reps;Standing Marching in Standing: AROM;Strengthening;10 reps;Left Standing Hip Extension: AROM;Strengthening;Left;5 reps    General Comments         Pertinent Vitals/Pain Pain Assessment: 0-10 Pain Score: 3  Pain Location: L hip Pain Descriptors / Indicators: Cramping Pain Intervention(s): Monitored during session;Limited activity within patient's tolerance;Ice applied    Home Living                      Prior Function            PT Goals (current goals can now be found in the care plan section) Acute Rehab PT Goals Patient Stated Goal: to go home PT Goal Formulation: With patient Time For Goal Achievement: 07/22/14 Potential to Achieve Goals: Good Progress towards PT goals: Progressing toward goals    Frequency  7X/week    PT Plan Current plan remains appropriate    Co-evaluation             End of Session Equipment Utilized During Treatment: Gait belt Activity Tolerance: Patient tolerated treatment well Patient left: in chair;with call bell/phone within reach     Time: 1334-1359 PT Time Calculation (min) (ACUTE ONLY): 25 min  Charges:  $Gait Training: 8-22 mins $Therapeutic Exercise: 8-22 mins                    G Codes:      Loman Logan 07/16/2014, 2:04 PM

## 2014-07-16 NOTE — Progress Notes (Signed)
Pt to d/c home with [facility] home health. No DME needs. AVS reviewed and "My Chart" discussed with pt. Pt capable of verbalizing medications, signs and symptoms of infection, and follow-up appointments. Remains hemodynamically stable. No signs and symptoms of distress. Educated pt to return to ER in the case of SOB, dizziness, or chest pain.  

## 2014-07-17 NOTE — Discharge Summary (Signed)
Physician Discharge Summary  Patient ID: Tina [name] MRN: [id number] DOB/AGE: [birth year] 63 y.o.  Admit date: 07/14/2014 Discharge date: 07/16/2014   Procedures:  Procedure(s) (LRB): LEFT TOTAL HIP ARTHROPLASTY ANTERIOR APPROACH (Left)  Attending Physician:  Dr. Durene Romans   Admission Diagnoses:   Left hip primary OA / pain  Discharge Diagnoses:  Principal Problem:   S/P left THA, AA Active Problems:   Overweight (BMI 25.0-29.9)  Past Medical History  Diagnosis Date  . Headache(784.0)     occasional  . Anxiety   . Depression   . Arthritis     bilateral hips , left hand   . Pneumonia     hx of     HPI: Tina [name], 64 y.o. female, has a history of pain and functional disability in the left hip(s) due to arthritis and patient has failed non-surgical conservative treatments for greater than 12 weeks to include NSAID's and/or analgesics, use of assistive devices and activity modification. Onset of symptoms was gradual starting 3+ years ago with gradually worsening course since that time.The patient noted prior procedures of the hip to include arthroplasty on the left hip, March 2013. Patient currently rates pain in the left hip at 10 out of 10 with activity. Patient has night pain, worsening of pain with activity and weight bearing, trendelenberg gait, pain that interfers with activities of daily living and pain with passive range of motion. Patient has evidence of periarticular osteophytes and joint space narrowing by imaging studies. This condition presents safety issues increasing the risk of falls. There is no current active infection. Risks, benefits and expectations were discussed with the patient. Risks including but not limited to the risk of anesthesia, blood clots, nerve damage, blood vessel damage, failure of the prosthesis, infection and up to and including death. Patient understand the risks, benefits and expectations and wishes to proceed with surgery.    PCP: PROVIDER NOT IN SYSTEM   Discharged Condition: good  Hospital Course:  Patient underwent the above stated procedure on 07/14/2014. Patient tolerated the procedure well and brought to the recovery room in good condition and subsequently to the floor.  POD #1 BP: 90/42 ; Pulse: 71 ; Temp: 98.9 F (37.2 C) ; Resp: 16 Patient reports pain as mild. Feeling pretty good. Reports that her planned care giver had recent heart attack and is currently unable to care for her. Dorsiflexion/plantar flexion intact, incision: dressing C/D/I, no cellulitis present and compartment soft.   LABS  Basename    HGB  9.2  HCT  28.8   POD #2  BP: 170/79 ; Pulse: 91 ; Temp: 98.1 F (36.7 C) ; Resp: 18 Patient reports pain as mild, pain controlled. No events throughout the night. Pretty nervous about the prospect of going home. Dr. Charlann Boxer has discussed this with her and her family. Dorsiflexion/plantar flexion intact, incision: dressing C/D/I, no cellulitis present and compartment soft.   LABS  Basename    HGB  8.9  HCT  27.8    Discharge Exam: General appearance: alert, cooperative and no distress Extremities: Homans sign is negative, no sign of DVT, no edema, redness or tenderness in the calves or thighs and no ulcers, gangrene or trophic changes  Disposition: Home with follow up in 2 weeks   Follow-up Information    Follow up with [facility].   Why:  home health physical therapy and aide   Contact information:   [address] [place] [address] [phone]  Follow up with Shelda Pal, MD. Schedule an appointment as soon as possible for a visit in 2 weeks.   Specialty:  Orthopedic Surgery   Contact information:   [address] [place] [address] [phone]       Discharge Instructions    Call MD / Call 911    Complete by:  As directed   If you experience chest pain or shortness of breath, CALL 911 and be transported to the  hospital emergency room.  If you develope a fever above 101 F, pus (white drainage) or increased drainage or redness at the wound, or calf pain, call your surgeon's office.     Change dressing    Complete by:  As directed   Maintain surgical dressing until follow up in the clinic. If the edges start to pull up, may reinforce with tape. If the dressing is no longer working, may remove and cover with gauze and tape, but must keep the area dry and clean.  Call with any questions or concerns.     Constipation Prevention    Complete by:  As directed   Drink plenty of fluids.  Prune juice may be helpful.  You may use a stool softener, such as Colace (over the counter) 100 mg twice a day.  Use MiraLax (over the counter) for constipation as needed.     Diet - low sodium heart healthy    Complete by:  As directed      Discharge instructions    Complete by:  As directed   Maintain surgical dressing until follow up in the clinic. If the edges start to pull up, may reinforce with tape. If the dressing is no longer working, may remove and cover with gauze and tape, but must keep the area dry and clean.  Follow up in 2 weeks at [facility] Call with any questions or concerns.     Increase activity slowly as tolerated    Complete by:  As directed      TED hose    Complete by:  As directed   Use stockings (TED hose) for 2 weeks on both leg(s).  You may remove them at night for sleeping.     Weight bearing as tolerated    Complete by:  As directed   Laterality:  left  Extremity:  Lower             Medication List    STOP taking these medications        diphenhydramine-acetaminophen 25-500 MG Tabs  Commonly known as:  TYLENOL PM     naproxen sodium 220 MG tablet  Commonly known as:  ANAPROX      TAKE these medications        aspirin 325 MG EC tablet  Take 1 tablet (325 mg total) by mouth 2 (two) times daily.     DSS 100 MG Caps  Take 100 mg by mouth 2 (two) times daily.  Notes  to Patient:  This is a stool softener     ferrous sulfate 325 (65 FE) MG tablet  Take 1 tablet (325 mg total) by mouth 3 (three) times daily after meals.     HYDROcodone-acetaminophen 7.5-325 MG per tablet  Commonly known as:  NORCO  Take 1-2 tablets by mouth every 4 (four) hours as needed for moderate pain.     methocarbamol 500 MG tablet  Commonly known as:  ROBAXIN  Take 1 tablet (500 mg total) by mouth every  6 (six) hours as needed for muscle spasms.     polyethylene glycol packet  Commonly known as:  MIRALAX / GLYCOLAX  Take 17 g by mouth 2 (two) times daily.     sertraline 100 MG tablet  Commonly known as:  ZOLOFT  Take 200 mg by mouth every morning.     traZODone 100 MG tablet  Commonly known as:  DESYREL  Take 100 mg by mouth at bedtime.         Signed: Anastasio AuerbachMatthew S. Haygen Zebrowski   PA-C  07/17/2014, 8:32 AM

## 2016-06-10 IMAGING — DX DG PORTABLE PELVIS
1 series · 1 of 1 positions shown · non-contrast
Comparison: 09/13/2011

CLINICAL DATA: Postop left hip surgery

EXAM:
PORTABLE PELVIS 1-2 VIEWS

[pelvis ap]
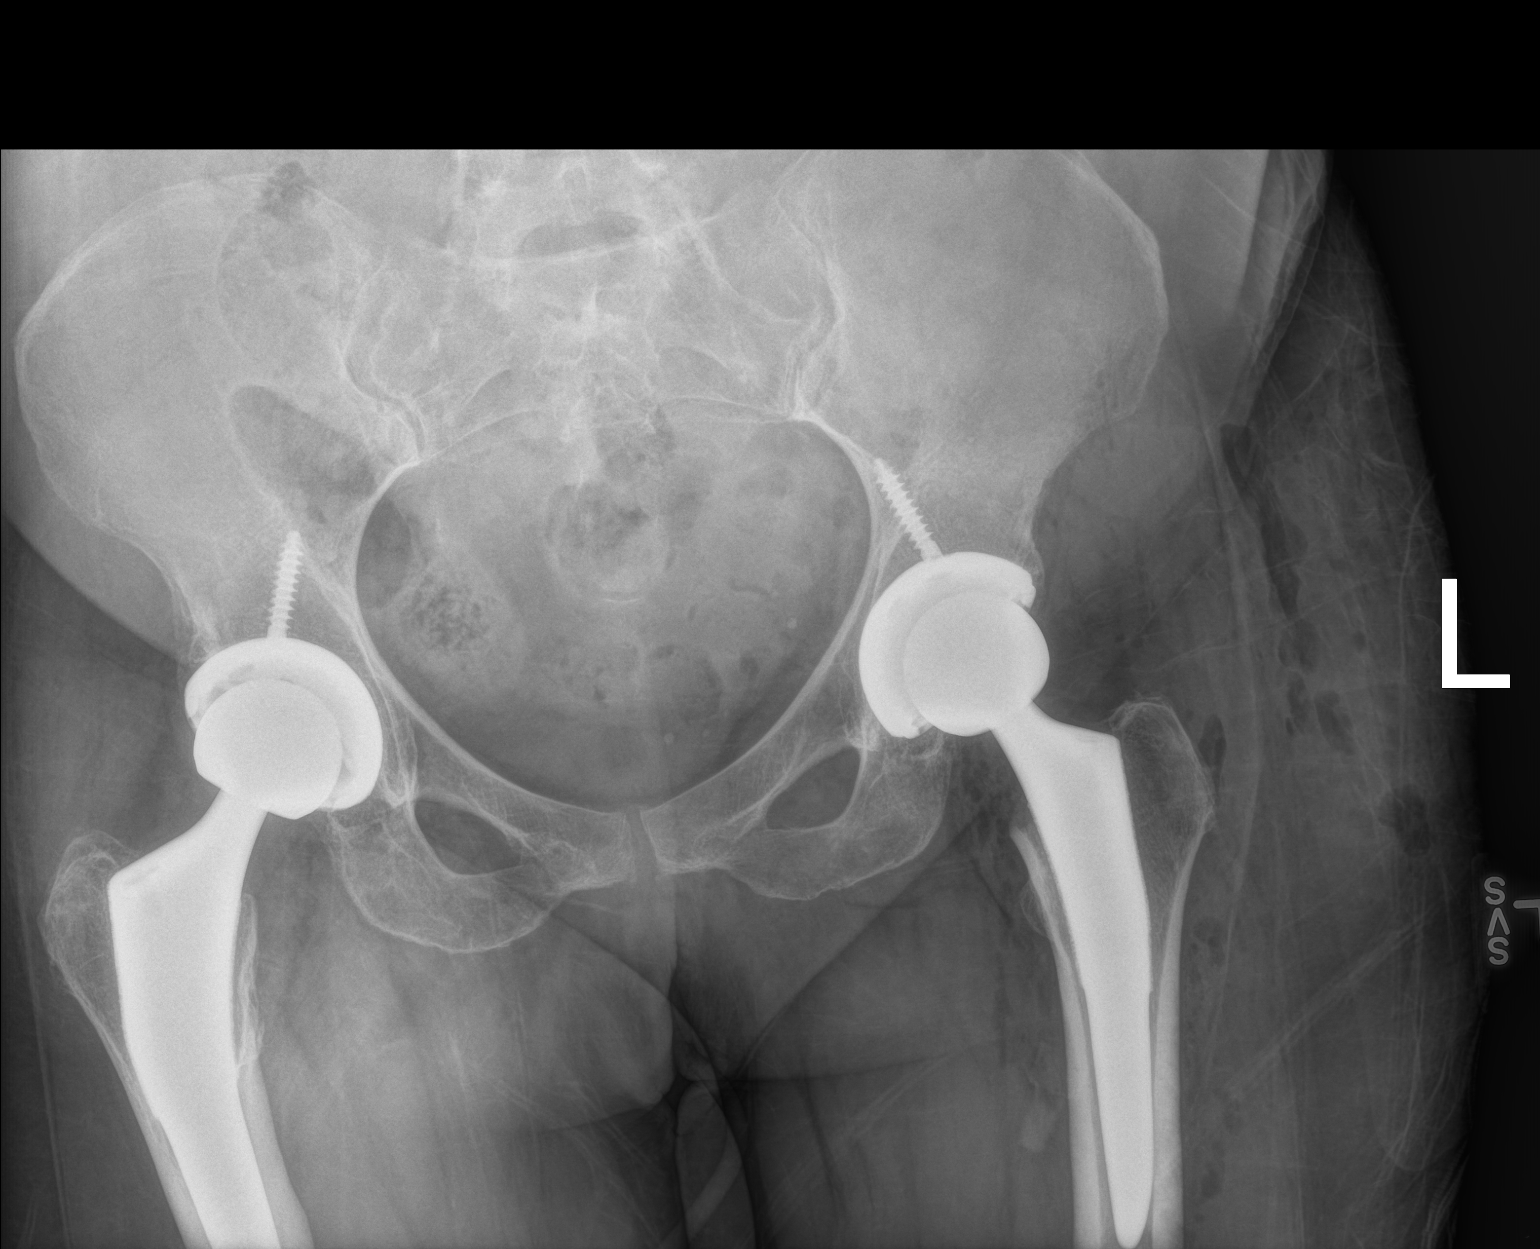

[1 of 1 positions shown; findings below may reference images not displayed]

FINDINGS: Stable right hip replacement. Complete left hip replacement has now
been performed as well with acetabular and femoral components in
anticipated position.

Anticipated soft tissue postoperative change over the left hip joint
and lateral left gluteal region.
IMPRESSION: Anticipated appearance status post left hip replacement.

## 2023-04-08 ENCOUNTER — Emergency Department (HOSPITAL_BASED_OUTPATIENT_CLINIC_OR_DEPARTMENT_OTHER): Payer: Medicare HMO

## 2023-04-08 ENCOUNTER — Encounter (HOSPITAL_BASED_OUTPATIENT_CLINIC_OR_DEPARTMENT_OTHER): Payer: Self-pay

## 2023-04-08 ENCOUNTER — Other Ambulatory Visit: Payer: Self-pay

## 2023-04-08 ENCOUNTER — Inpatient Hospital Stay (HOSPITAL_BASED_OUTPATIENT_CLINIC_OR_DEPARTMENT_OTHER)
Admission: EM | Admit: 2023-04-08 | Discharge: 2023-04-12 | DRG: 286 | Disposition: A | Discharge status: Home Health | Payer: Medicare HMO | Attending: Internal Medicine | Admitting: Internal Medicine

## 2023-04-08 DIAGNOSIS — I272 Pulmonary hypertension, unspecified: Secondary | ICD-10-CM | POA: Diagnosis not present

## 2023-04-08 DIAGNOSIS — E785 Hyperlipidemia, unspecified: Secondary | ICD-10-CM | POA: Diagnosis not present

## 2023-04-08 DIAGNOSIS — I480 Paroxysmal atrial fibrillation: Secondary | ICD-10-CM | POA: Diagnosis not present

## 2023-04-08 DIAGNOSIS — M7989 Other specified soft tissue disorders: Secondary | ICD-10-CM | POA: Diagnosis not present

## 2023-04-08 DIAGNOSIS — R0602 Shortness of breath: Secondary | ICD-10-CM | POA: Diagnosis present

## 2023-04-08 DIAGNOSIS — I2510 Atherosclerotic heart disease of native coronary artery without angina pectoris: Secondary | ICD-10-CM | POA: Diagnosis present

## 2023-04-08 DIAGNOSIS — Z96643 Presence of artificial hip joint, bilateral: Secondary | ICD-10-CM | POA: Diagnosis not present

## 2023-04-08 DIAGNOSIS — J432 Centrilobular emphysema: Secondary | ICD-10-CM | POA: Diagnosis not present

## 2023-04-08 DIAGNOSIS — F32A Depression, unspecified: Secondary | ICD-10-CM | POA: Diagnosis present

## 2023-04-08 DIAGNOSIS — J9601 Acute respiratory failure with hypoxia: Secondary | ICD-10-CM | POA: Diagnosis present

## 2023-04-08 DIAGNOSIS — I509 Heart failure, unspecified: Secondary | ICD-10-CM

## 2023-04-08 DIAGNOSIS — I700 Atherosclerosis of aorta: Secondary | ICD-10-CM | POA: Diagnosis not present

## 2023-04-08 DIAGNOSIS — Z7982 Long term (current) use of aspirin: Secondary | ICD-10-CM

## 2023-04-08 DIAGNOSIS — Z79899 Other long term (current) drug therapy: Secondary | ICD-10-CM

## 2023-04-08 DIAGNOSIS — I5033 Acute on chronic diastolic (congestive) heart failure: Secondary | ICD-10-CM | POA: Diagnosis not present

## 2023-04-08 DIAGNOSIS — E876 Hypokalemia: Secondary | ICD-10-CM | POA: Diagnosis not present

## 2023-04-08 DIAGNOSIS — Z23 Encounter for immunization: Secondary | ICD-10-CM

## 2023-04-08 DIAGNOSIS — I058 Other rheumatic mitral valve diseases: Secondary | ICD-10-CM | POA: Diagnosis present

## 2023-04-08 DIAGNOSIS — Z981 Arthrodesis status: Secondary | ICD-10-CM | POA: Diagnosis not present

## 2023-04-08 DIAGNOSIS — Z87891 Personal history of nicotine dependence: Secondary | ICD-10-CM | POA: Diagnosis not present

## 2023-04-08 DIAGNOSIS — F419 Anxiety disorder, unspecified: Secondary | ICD-10-CM | POA: Diagnosis not present

## 2023-04-08 DIAGNOSIS — I050 Rheumatic mitral stenosis: Secondary | ICD-10-CM | POA: Diagnosis present

## 2023-04-08 DIAGNOSIS — I110 Hypertensive heart disease with heart failure: Secondary | ICD-10-CM | POA: Diagnosis not present

## 2023-04-08 DIAGNOSIS — R918 Other nonspecific abnormal finding of lung field: Secondary | ICD-10-CM | POA: Diagnosis present

## 2023-04-08 DIAGNOSIS — I252 Old myocardial infarction: Secondary | ICD-10-CM

## 2023-04-08 DIAGNOSIS — I4719 Other supraventricular tachycardia: Secondary | ICD-10-CM | POA: Diagnosis not present

## 2023-04-08 DIAGNOSIS — I4891 Unspecified atrial fibrillation: Principal | ICD-10-CM | POA: Diagnosis present

## 2023-04-08 LAB — COMPREHENSIVE METABOLIC PANEL
ALT: 13 U/L (ref 0–44)
AST: 24 U/L (ref 15–41)
Albumin: 3.8 g/dL (ref 3.5–5.0)
Alkaline Phosphatase: 50 U/L (ref 38–126)
Anion gap: 11 (ref 5–15)
BUN: 18 mg/dL (ref 8–23)
CO2: 25 mmol/L (ref 22–32)
Calcium: 8.9 mg/dL (ref 8.9–10.3)
Chloride: 100 mmol/L (ref 98–111)
Creatinine, Ser: 1.02 mg/dL — ABNORMAL HIGH (ref 0.44–1.00)
GFR, Estimated: 58 mL/min — ABNORMAL LOW (ref >60)
Glucose, Bld: 123 mg/dL — ABNORMAL HIGH (ref 70–99)
Potassium: 3.7 mmol/L (ref 3.5–5.1)
Sodium: 136 mmol/L (ref 135–145)
Total Bilirubin: 0.6 mg/dL (ref 0.3–1.2)
Total Protein: 7.9 g/dL (ref 6.5–8.1)

## 2023-04-08 LAB — CBC WITH DIFFERENTIAL/PLATELET
Abs Immature Granulocytes: 0.02 10*3/uL (ref 0.00–0.07)
Basophils Absolute: 0.1 10*3/uL (ref 0.0–0.1)
Basophils Relative: 1 %
Eosinophils Absolute: 0.1 10*3/uL (ref 0.0–0.5)
Eosinophils Relative: 2 %
HCT: 40.3 % (ref 36.0–46.0)
Hemoglobin: 12.7 g/dL (ref 12.0–15.0)
Immature Granulocytes: 0 %
Lymphocytes Relative: 19 %
Lymphs Abs: 1.3 10*3/uL (ref 0.7–4.0)
MCH: 28.9 pg (ref 26.0–34.0)
MCHC: 31.5 g/dL (ref 30.0–36.0)
MCV: 91.8 fL (ref 80.0–100.0)
Monocytes Absolute: 0.5 10*3/uL (ref 0.1–1.0)
Monocytes Relative: 7 %
Neutro Abs: 4.8 10*3/uL (ref 1.7–7.7)
Neutrophils Relative %: 71 %
Platelets: 313 10*3/uL (ref 150–400)
RBC: 4.39 MIL/uL (ref 3.87–5.11)
RDW: 15.1 % (ref 11.5–15.5)
WBC: 6.8 10*3/uL (ref 4.0–10.5)
nRBC: 0 % (ref 0.0–0.2)

## 2023-04-08 LAB — BRAIN NATRIURETIC PEPTIDE: B Natriuretic Peptide: 722.8 pg/mL — ABNORMAL HIGH (ref 0.0–100.0)

## 2023-04-08 LAB — TROPONIN I (HIGH SENSITIVITY)
Troponin I (High Sensitivity): 26 ng/L — ABNORMAL HIGH (ref <18)
Troponin I (High Sensitivity): 30 ng/L — ABNORMAL HIGH (ref <18)

## 2023-04-08 MED ORDER — APIXABAN 5 MG PO TABS
5.0000 mg | ORAL_TABLET | Freq: Two times a day (BID) | ORAL | Status: DC
  Administered 2023-04-08: 5 mg via ORAL
  Filled 2023-04-08: qty 2

## 2023-04-08 MED ORDER — FUROSEMIDE 10 MG/ML IJ SOLN
40.0000 mg | Freq: Once | INTRAMUSCULAR | Status: AC
  Administered 2023-04-08: 40 mg via INTRAVENOUS
  Filled 2023-04-08: qty 4

## 2023-04-08 MED ORDER — IOHEXOL 350 MG/ML SOLN
75.0000 mL | Freq: Once | INTRAVENOUS | Status: AC | PRN
  Administered 2023-04-08: 75 mL via INTRAVENOUS

## 2023-04-08 MED ORDER — IOHEXOL 350 MG/ML SOLN: 75.0000 mL | Freq: Once | INTRAVENOUS | Status: DC | PRN

## 2023-04-08 MED ORDER — DILTIAZEM LOAD VIA INFUSION
10.0000 mg | Freq: Once | INTRAVENOUS | Status: AC
  Administered 2023-04-08: 10 mg via INTRAVENOUS
  Filled 2023-04-08: qty 10

## 2023-04-08 MED ORDER — DILTIAZEM HCL-DEXTROSE 125-5 MG/125ML-% IV SOLN (PREMIX)
5.0000 mg/h | INTRAVENOUS | Status: DC
  Administered 2023-04-08: 5 mg/h via INTRAVENOUS
  Filled 2023-04-08: qty 125

## 2023-04-08 NOTE — ED Triage Notes (Addendum)
POV from home, A&O x 4, gcs 15, bib wheelchair  C/o right ankle swelling x weeks, denies injury, feels sob and breathing labored with exertion more than normal, sts sob all the time but worsened today

## 2023-04-08 NOTE — ED Provider Notes (Signed)
  Provider Note MRN:  [id number]  Arrival date & time: 04/08/23    ED Course and Medical Decision Making  Assumed care from Dr. Silverio Lay at shift change.  New onset A-fib, new onset CHF plan is for admission.  Mild RVR, mild hypoxia.  Will also obtain cardiology recommendations.  Procedures  Final Clinical Impressions(s) / ED Diagnoses     ICD-10-CM   1. Atrial fibrillation with rapid ventricular response (HCC)  I48.91     2. Acute congestive heart failure, unspecified heart failure type [facility])  I50.9       ED Discharge Orders     None       Discharge Instructions   None     Elmer Sow. Pilar Plate, MD [facility] Emergency Medicine [facility] [email or web address]    Sabas Sous, MD 04/09/23 [phone]

## 2023-04-08 NOTE — ED Provider Notes (Signed)
[facility] Provider Note   CSN: [id number] Arrival date & time: 04/08/23  1935     History  Chief Complaint  Patient presents with   Leg Swelling   Shortness of Breath    Tina [name] is a 72 y.o. female history of hypertension, hyperlipidemia, here presenting with leg swelling and shortness of breath.  Patient has been having right leg swelling and right ankle and foot pain for the last week or so.  Patient was at a birthday party and got very short of breath.  She states that she has been having some shortness of breath has been going on for about a week or so.  Denies any fevers or sick contacts.  The history is provided by the patient.       Home Medications Prior to Admission medications   Medication Sig Start Date End Date Taking? Authorizing Provider  aspirin EC 325 MG EC tablet Take 1 tablet (325 mg total) by mouth 2 (two) times daily. 07/16/14   Lanney Gins, PA-C  docusate sodium 100 MG CAPS Take 100 mg by mouth 2 (two) times daily. 07/16/14   Lanney Gins, PA-C  ferrous sulfate 325 (65 FE) MG tablet Take 1 tablet (325 mg total) by mouth 3 (three) times daily after meals. 07/16/14   Lanney Gins, PA-C  HYDROcodone-acetaminophen (NORCO) 7.5-325 MG per tablet Take 1-2 tablets by mouth every 4 (four) hours as needed for moderate pain. 07/16/14   Lanney Gins, PA-C  methocarbamol (ROBAXIN) 500 MG tablet Take 1 tablet (500 mg total) by mouth every 6 (six) hours as needed for muscle spasms. 07/16/14   Lanney Gins, PA-C  polyethylene glycol (MIRALAX / GLYCOLAX) packet Take 17 g by mouth 2 (two) times daily. 07/16/14   Lanney Gins, PA-C  sertraline (ZOLOFT) 100 MG tablet Take 200 mg by mouth every morning.    [provider]  traZODone (DESYREL) 100 MG tablet Take 100 mg by mouth at bedtime.    [provider]      Allergies    Patient has no known allergies.    Review of Systems   Review of  Systems  Respiratory:  Positive for shortness of breath.   All other systems reviewed and are negative.   Physical Exam Updated Vital Signs BP (!) 115/56 (BP Location: Left Arm)   Pulse (!) 113   Temp 98.2 F (36.8 C) (Oral)   Resp 20   Ht 5\' 4"  (1.626 m)   Wt 63.5 kg   SpO2 90%   BMI 24.03 kg/m  Physical Exam Vitals and nursing note reviewed.  Constitutional:      Comments: Chronically ill-appearing  HENT:     Head: Normocephalic.  Eyes:     Pupils: Pupils are equal, round, and reactive to light.  Cardiovascular:     Rate and Rhythm: Tachycardia present. Rhythm irregular.  Pulmonary:     Comments: Slightly tachypneic and diminished bilateral bases Abdominal:     Palpations: Abdomen is soft.  Musculoskeletal:     Comments: Right foot is swollen and right ankle swollen.  Mild right calf tenderness.  Skin:    General: Skin is warm.     Capillary Refill: Capillary refill takes less than 2 seconds.  Neurological:     General: No focal deficit present.     Mental Status: She is oriented to person, place, and time.  Psychiatric:        Mood and Affect: Mood  normal.        Behavior: Behavior normal.     ED Results / Procedures / Treatments   Labs (all labs ordered are listed, but only abnormal results are displayed) Labs Reviewed  COMPREHENSIVE METABOLIC PANEL - Abnormal; Notable for the following components:      Result Value   Glucose, Bld 123 (*)    Creatinine, Ser 1.02 (*)    GFR, Estimated 58 (*)    All other components within normal limits  TROPONIN I (HIGH SENSITIVITY) - Abnormal; Notable for the following components:   Troponin I (High Sensitivity) 26 (*)    All other components within normal limits  CBC WITH DIFFERENTIAL/PLATELET  BRAIN NATRIURETIC PEPTIDE    EKG EKG Interpretation Date/Time:  Saturday April 08 2023 19:51:17 EDT Ventricular Rate:  97 PR Interval:  152 QRS Duration:  89 QT Interval:  385 QTC Calculation: 472 R  Axis:   253  Text Interpretation: Ectopic atrial rhythm Multiple premature complexes, vent & supraven Abnormal R-wave progression, late transition Inferior infarct, old No significant change since last tracing Confirmed by Richardean Canal [phone]) on 04/08/2023 8:53:07 PM  Radiology US Venous Img Lower Unilateral Right  Result Date: 04/08/2023 CLINICAL DATA:  Right leg swelling EXAM: RIGHT LOWER EXTREMITY VENOUS DOPPLER ULTRASOUND TECHNIQUE: Gray-scale sonography with graded compression, as well as color Doppler and duplex ultrasound were performed to evaluate the lower extremity deep venous systems from the level of the common femoral vein and including the common femoral, femoral, profunda femoral, popliteal and calf veins including the posterior tibial, peroneal and gastrocnemius veins when visible. The superficial great saphenous vein was also interrogated. Spectral Doppler was utilized to evaluate flow at rest and with distal augmentation maneuvers in the common femoral, femoral and popliteal veins. COMPARISON:  None Available. FINDINGS: Contralateral Common Femoral Vein: Respiratory phasicity is normal and symmetric with the symptomatic side. No evidence of thrombus. Normal compressibility. Common Femoral Vein: No evidence of thrombus. Normal compressibility, respiratory phasicity and response to augmentation. Saphenofemoral Junction: No evidence of thrombus. Normal compressibility and flow on color Doppler imaging. Profunda Femoral Vein: No evidence of thrombus. Normal compressibility and flow on color Doppler imaging. Femoral Vein: Not well visualized distally due to overlying edema. Popliteal Vein: No evidence of thrombus. Normal compressibility, respiratory phasicity and response to augmentation. Calf Veins: Limited visualization due to overlying edema. Superficial Great Saphenous Vein: No evidence of thrombus. Normal compressibility. Venous Reflux:  None. Other Findings:  None. IMPRESSION: Somewhat  limited exam to overlying edema and patient discomfort. No definitive thrombus is noted. Electronically Signed   By: Alcide Clever M.D.   On: 04/08/2023 21:22    Procedures Procedures    CRITICAL CARE Performed by: Richardean Canal   Total critical care time: 45 minutes  Critical care time was exclusive of separately billable procedures and treating other patients.  Critical care was necessary to treat or prevent imminent or life-threatening deterioration.  Critical care was time spent personally by me on the following activities: development of treatment plan with patient and/or surrogate as well as nursing, discussions with consultants, evaluation of patient's response to treatment, examination of patient, obtaining history from patient or surrogate, ordering and performing treatments and interventions, ordering and review of laboratory studies, ordering and review of radiographic studies, pulse oximetry and re-evaluation of patient's condition.   Medications Ordered in ED Medications - No data to display  ED Course/ Medical Decision Making/ A&P  Medical Decision Making SABA GOMM is a 72 y.o. female here with shortness of breath and right leg swelling.  Concern for possible DVT versus PE.  Also consider heart failure as well.  Plan to get CBC and CMP and BNP and DVT study of the right leg and CTA chest.  Patient also appears to be in new onset a flutter. CHADVASC is 3 so will need anticoagulation.   10:50 PM BNP is elevated at 700.  CTA did not show a PE.  Patient's heart rate is between 80-100.  However patient is hypoxic with oxygen 89 to 90%.  With 2 L nasal cannula, patient's oxygen is above 95%.  Given Lasix in the ED.  I have also ordered Eliquis.  Given new onset A-fib and likely new onset heart failure, patient will need to be admitted for diuresis and echo.    11:18 PM I paged cardiology and hospitalist.  Dr. Pilar Plate to talk to them and admit  patient.   Amount and/or Complexity of Data Reviewed Labs: ordered. Decision-making details documented in ED Course. Radiology: ordered and independent interpretation performed. Decision-making details documented in ED Course. ECG/medicine tests: ordered and independent interpretation performed. Decision-making details documented in ED Course.  Risk Prescription drug management.    Final Clinical Impression(s) / ED Diagnoses Final diagnoses:  None    Rx / DC Orders ED Discharge Orders     None         Charlynne Pander, MD 04/08/23 2318

## 2023-04-09 ENCOUNTER — Observation Stay (HOSPITAL_BASED_OUTPATIENT_CLINIC_OR_DEPARTMENT_OTHER): Payer: Medicare HMO

## 2023-04-09 ENCOUNTER — Encounter (HOSPITAL_COMMUNITY): Payer: Medicare HMO

## 2023-04-09 DIAGNOSIS — I342 Nonrheumatic mitral (valve) stenosis: Secondary | ICD-10-CM | POA: Diagnosis not present

## 2023-04-09 DIAGNOSIS — I5033 Acute on chronic diastolic (congestive) heart failure: Secondary | ICD-10-CM | POA: Insufficient documentation

## 2023-04-09 DIAGNOSIS — R9431 Abnormal electrocardiogram [ECG] [EKG]: Secondary | ICD-10-CM

## 2023-04-09 DIAGNOSIS — I10 Essential (primary) hypertension: Secondary | ICD-10-CM | POA: Diagnosis not present

## 2023-04-09 DIAGNOSIS — R918 Other nonspecific abnormal finding of lung field: Secondary | ICD-10-CM | POA: Diagnosis not present

## 2023-04-09 DIAGNOSIS — J9601 Acute respiratory failure with hypoxia: Secondary | ICD-10-CM | POA: Diagnosis not present

## 2023-04-09 DIAGNOSIS — I509 Heart failure, unspecified: Secondary | ICD-10-CM | POA: Diagnosis not present

## 2023-04-09 DIAGNOSIS — I4719 Other supraventricular tachycardia: Secondary | ICD-10-CM | POA: Diagnosis not present

## 2023-04-09 DIAGNOSIS — I5031 Acute diastolic (congestive) heart failure: Secondary | ICD-10-CM | POA: Diagnosis not present

## 2023-04-09 DIAGNOSIS — I480 Paroxysmal atrial fibrillation: Secondary | ICD-10-CM | POA: Diagnosis not present

## 2023-04-09 LAB — ECHOCARDIOGRAM COMPLETE
AR max vel: 2 cm2
AV Area VTI: 1.76 cm2
AV Area mean vel: 1.83 cm2
AV Mean grad: 3 mm[Hg]
AV Peak grad: 5.4 mm[Hg]
Ao pk vel: 1.16 m/s
Area-P 1/2: 3.77 cm2
Height: 64 in
MV M vel: 3.49 m/s
MV Peak grad: 48.7 mm[Hg]
MV VTI: 0.56 cm2
S' Lateral: 3.5 cm
Weight: 2137.58 [oz_av]

## 2023-04-09 MED ORDER — SODIUM CHLORIDE 0.9% FLUSH
3.0000 mL | Freq: Two times a day (BID) | INTRAVENOUS | Status: DC
  Administered 2023-04-09 – 2023-04-12 (×7): 3 mL via INTRAVENOUS

## 2023-04-09 MED ORDER — FUROSEMIDE 10 MG/ML IJ SOLN
40.0000 mg | Freq: Once | INTRAMUSCULAR | Status: AC
  Administered 2023-04-09: 40 mg via INTRAVENOUS
  Filled 2023-04-09: qty 4

## 2023-04-09 MED ORDER — SODIUM CHLORIDE 0.9 % IV SOLN: 250.0000 mL | INTRAVENOUS | Status: DC | PRN

## 2023-04-09 MED ORDER — ACETAMINOPHEN 325 MG PO TABS
650.0000 mg | ORAL_TABLET | ORAL | Status: DC | PRN
  Administered 2023-04-10 – 2023-04-12 (×2): 650 mg via ORAL
  Filled 2023-04-09 (×3): qty 2

## 2023-04-09 MED ORDER — SODIUM CHLORIDE 0.9% FLUSH: 3.0000 mL | INTRAVENOUS | Status: DC | PRN

## 2023-04-09 MED ORDER — ONDANSETRON HCL 4 MG/2ML IJ SOLN: 4.0000 mg | Freq: Four times a day (QID) | INTRAMUSCULAR | Status: DC | PRN

## 2023-04-09 MED ORDER — FUROSEMIDE 10 MG/ML IJ SOLN
40.0000 mg | Freq: Two times a day (BID) | INTRAMUSCULAR | Status: DC
  Administered 2023-04-10 (×2): 40 mg via INTRAVENOUS
  Filled 2023-04-09 (×2): qty 4

## 2023-04-09 MED ORDER — METOPROLOL TARTRATE 12.5 MG HALF TABLET
12.5000 mg | ORAL_TABLET | Freq: Two times a day (BID) | ORAL | Status: DC
  Administered 2023-04-09 – 2023-04-12 (×6): 12.5 mg via ORAL
  Filled 2023-04-09 (×6): qty 1

## 2023-04-09 MED ORDER — ENOXAPARIN SODIUM 40 MG/0.4ML IJ SOSY
40.0000 mg | PREFILLED_SYRINGE | INTRAMUSCULAR | Status: DC
  Administered 2023-04-09: 40 mg via SUBCUTANEOUS
  Filled 2023-04-09: qty 0.4

## 2023-04-09 MED ORDER — INFLUENZA VAC A&B SURF ANT ADJ 0.5 ML IM SUSY
0.5000 mL | PREFILLED_SYRINGE | INTRAMUSCULAR | Status: AC
  Administered 2023-04-10: 0.5 mL via INTRAMUSCULAR
  Filled 2023-04-09: qty 0.5

## 2023-04-09 MED ORDER — PNEUMOCOCCAL 20-VAL CONJ VACC 0.5 ML IM SUSY
0.5000 mL | PREFILLED_SYRINGE | INTRAMUSCULAR | Status: AC
  Administered 2023-04-10: 0.5 mL via INTRAMUSCULAR
  Filled 2023-04-09: qty 0.5

## 2023-04-09 MED ORDER — FUROSEMIDE 10 MG/ML IJ SOLN
40.0000 mg | Freq: Every day | INTRAMUSCULAR | Status: DC
  Administered 2023-04-09: 40 mg via INTRAVENOUS
  Filled 2023-04-09: qty 4

## 2023-04-09 MED ORDER — APIXABAN 5 MG PO TABS
5.0000 mg | ORAL_TABLET | Freq: Two times a day (BID) | ORAL | Status: DC
  Administered 2023-04-09: 5 mg via ORAL
  Filled 2023-04-09: qty 1

## 2023-04-09 NOTE — Progress Notes (Signed)
PROGRESS NOTE    Tina [name]  ZOX:[id number] DOB: [birth year] DOA: 04/08/2023 PCP: System, Provider Not In   Brief Narrative: Tina [name] is a 72 y.o. female with a history of hypertension, hyperlipidemia.  Patient presented secondary to leg swelling and shortness of breath with concern for acute heart failure in addition to atrial fibrillation with RVR converted to sinus rhythm after Cardizem. Patient started on Lasix IV with some improvement of symptoms. Supplemental oxygen provided for associated hypoxia. Transthoracic Echocardiogram obtained and significant for mitral valve stenosis. Cardiology consulted.   Assessment and Plan:  Paroxymal atrial fibrillation with RVR Atrial fibrillation rhythm noted in the ED prior to admission with conversion to normal sinus rhythm after Cardizem bolus and short infusion. No anticoagulation started on admission. -Cardiology consult  Acute heart failure Elevated BNP of 722.8 with pleural effusion and interstitial edema; associated dyspnea that improved with Lasix IV. Transthoracic Echocardiogram significant for a normal LVEF of 65-70% but was significant moderate-severe mitral stenosis. -Continue Lasix -Cardiology consult  Mitral valve stenosis Moderate-severe on Transthoracic Echocardiogram. -Cardiology consult  Pulmonary nodules -located in left long. Recommendation for non-contrast chest CT in 3-6 months.  Acute respiratory failure with hypoxia Hypoxia with ambulation on room air with SpO2 of 85% per nursing and associated dyspnea.  Right leg swelling Lower extremity venous duplex negative for DVT.   DVT prophylaxis: Lovenox Code Status:   Code Status: Full Code Family Communication: None at bedside Disposition Plan: Discharge pending ongoing cardiology recommendations/management in addition to PT/OT recommendations and wean to room air.   Consultants:  Cardiology  Procedures:  Transthoracic  Echocardiogram  Antimicrobials: None    Subjective: Patient reports no specific concerns this morning.  Objective: BP 128/66 (BP Location: Right Arm)   Pulse 80   Temp 98 F (36.7 C) (Oral)   Resp 20   Ht 5\' 4"  (1.626 m)   Wt 60.6 kg   SpO2 95%   BMI 22.93 kg/m   Examination:  General exam: Appears calm and comfortable Respiratory system: Clear to auscultation. Respiratory effort normal. Cardiovascular system: S1 & S2 heard, RRR. Gastrointestinal system: Abdomen is nondistended, soft and nontender. Normal bowel sounds heard. Central nervous system: Alert and oriented. No focal neurological deficits. Musculoskeletal: Mild LE edema. No calf tenderness Skin: No cyanosis. No rashes Psychiatry: Judgement and insight appear normal. Mood & affect appropriate.    Data Reviewed: I have personally reviewed following labs and imaging studies  CBC Lab Results  Component Value Date   WBC 6.8 04/08/2023   RBC 4.39 04/08/2023   HGB 12.7 04/08/2023   HCT 40.3 04/08/2023   MCV 91.8 04/08/2023   MCH 28.9 04/08/2023   PLT 313 04/08/2023   MCHC 31.5 04/08/2023   RDW 15.1 04/08/2023   LYMPHSABS 1.3 04/08/2023   MONOABS 0.5 04/08/2023   EOSABS 0.1 04/08/2023   BASOSABS 0.1 04/08/2023     Last metabolic panel Lab Results  Component Value Date   NA 136 04/08/2023   K 3.7 04/08/2023   CL 100 04/08/2023   CO2 25 04/08/2023   BUN 18 04/08/2023   CREATININE 1.02 (H) 04/08/2023   GLUCOSE 123 (H) 04/08/2023   GFRNONAA 58 (L) 04/08/2023   GFRAA >90 07/16/2014   CALCIUM 8.9 04/08/2023   PROT 7.9 04/08/2023   ALBUMIN 3.8 04/08/2023   BILITOT 0.6 04/08/2023   ALKPHOS 50 04/08/2023   AST 24 04/08/2023   ALT 13 04/08/2023   ANIONGAP 11 04/08/2023    GFR: Estimated Creatinine  Clearance: 42.4 mL/min (A) (by C-G formula based on SCr of 1.02 mg/dL (H)).  No results found for this or any previous visit (from the past 240 hour(s)).    Radiology Studies: CT Angio Chest PE W  and/or Wo Contrast  Result Date: 04/08/2023 CLINICAL DATA:  Shortness of breath, labored breathing with exertion. EXAM: CT ANGIOGRAPHY CHEST WITH CONTRAST TECHNIQUE: Multidetector CT imaging of the chest was performed using the standard protocol during bolus administration of intravenous contrast. Multiplanar CT image reconstructions and MIPs were obtained to evaluate the vascular anatomy. RADIATION DOSE REDUCTION: This exam was performed according to the departmental dose-optimization program which includes automated exposure control, adjustment of the mA and/or kV according to patient size and/or use of iterative reconstruction technique. CONTRAST:  75mL OMNIPAQUE IOHEXOL 350 MG/ML SOLN COMPARISON:  Chest x-ray today. FINDINGS: Cardiovascular: No filling defects in the pulmonary arteries to suggest pulmonary emboli. Cardiomegaly. Diffusely calcified coronary arteries. Moderate aortic calcifications. No evidence of aortic aneurysm. Small pericardial effusion. Densely calcified mitral valve annulus. Mediastinum/Nodes: Precarinal lymph node has a short axis diameter of 14 mm. Right hilar lymph node has a short axis diameter of 15 mm. No axillary or left hilar adenopathy. Trachea and esophagus are unremarkable. Thyroid unremarkable. Lungs/Pleura: Small bilateral pleural effusions. Dependent bibasilar atelectasis. Mild centrilobular emphysema. Nodular density in the left upper lobe measures 1.8 x 1.1 cm. Favor scarring, but this warrants follow-up. Nodular density in the lingula measuring up to 10 mm. Interstitial prominence peripherally, favor scarring/early fibrosis. Upper Abdomen: No acute abnormality Musculoskeletal: Chest wall soft tissues are unremarkable. No acute bony abnormality. Review of the MIP images confirms the above findings. IMPRESSION: No evidence of pulmonary embolus. Cardiomegaly.  Diffuse coronary artery disease. Small bilateral pleural effusions, bibasilar and dependent atelectasis. Nodular  opacities in the left lung. Non-contrast chest CT at 3-6 months is recommended. If the nodules are stable at time of repeat CT, then future CT at 18-24 months (from today's scan) is considered optional for low-risk patients, but is recommended for high-risk patients. This recommendation follows the consensus statement: Guidelines for Management of Incidental Pulmonary Nodules Detected on CT Images: From the Fleischner Society 2017; Radiology 2017; 284:228-243. Aortic Atherosclerosis (ICD10-I70.0) and Emphysema (ICD10-J43.9). Electronically Signed   By: Charlett Nose M.D.   On: 04/08/2023 22:24   DG Foot Complete Right  Result Date: 04/08/2023 CLINICAL DATA:  Right leg pain.  Ankle swelling.  No known injury. EXAM: RIGHT FOOT COMPLETE - 3+ VIEW COMPARISON:  None Available. 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 Old fracture fragment off the tip of the lateral malleolus which is well corticated. No acute fracture, subluxation or dislocation. Soft tissues are intact. IMPRESSION: No acute bony abnormality. Electronically Signed   By: Charlett Nose M.D.   On: 04/08/2023 21:49   DG Ankle Complete Right  Result Date: 04/08/2023 CLINICAL DATA:  Right ankle swelling, pain.  No known injury. EXAM: RIGHT ANKLE - COMPLETE 3+ VIEW COMPARISON:  None Available. FINDINGS: Old lateral malleolar fracture. No acute fracture, subluxation or dislocation. Mild spurring within the right ankle joint. Diffuse soft tissue swelling. IMPRESSION: No acute bony abnormality. Electronically Signed   By: Charlett Nose M.D.   On: 04/08/2023 21:48   DG Chest Port 1 View  Result Date: 04/08/2023 CLINICAL DATA:  Shortness of breath EXAM: PORTABLE CHEST 1 VIEW COMPARISON:  None Available. FINDINGS: Cardiomegaly. Diffuse interstitial prominence throughout the lungs may reflect interstitial edema. Small bilateral pleural effusions. No acute bony abnormality. IMPRESSION: Cardiomegaly  with diffuse interstitial prominence, likely  interstitial edema. Small bilateral pleural effusions. Electronically Signed   By: Charlett Nose M.D.   On: 04/08/2023 21:48   US Venous Img Lower Unilateral Right  Result Date: 04/08/2023 CLINICAL DATA:  Right leg swelling EXAM: RIGHT LOWER EXTREMITY VENOUS DOPPLER ULTRASOUND TECHNIQUE: Gray-scale sonography with graded compression, as well as color Doppler and duplex ultrasound were performed to evaluate the lower extremity deep venous systems from the level of the common femoral vein and including the common femoral, femoral, profunda femoral, popliteal and calf veins including the posterior tibial, peroneal and gastrocnemius veins when visible. The superficial great saphenous vein was also interrogated. Spectral Doppler was utilized to evaluate flow at rest and with distal augmentation maneuvers in the common femoral, femoral and popliteal veins. COMPARISON:  None Available. FINDINGS: Contralateral Common Femoral Vein: Respiratory phasicity is normal and symmetric with the symptomatic side. No evidence of thrombus. Normal compressibility. Common Femoral Vein: No evidence of thrombus. Normal compressibility, respiratory phasicity and response to augmentation. Saphenofemoral Junction: No evidence of thrombus. Normal compressibility and flow on color Doppler imaging. Profunda Femoral Vein: No evidence of thrombus. Normal compressibility and flow on color Doppler imaging. Femoral Vein: Not well visualized distally due to overlying edema. Popliteal Vein: No evidence of thrombus. Normal compressibility, respiratory phasicity and response to augmentation. Calf Veins: Limited visualization due to overlying edema. Superficial Great Saphenous Vein: No evidence of thrombus. Normal compressibility. Venous Reflux:  None. Other Findings:  None. IMPRESSION: Somewhat limited exam to overlying edema and patient discomfort. No definitive thrombus is noted. Electronically Signed   By: Alcide Clever M.D.   On: 04/08/2023 21:22       LOS: 0 days    Jacquelin Hawking, MD [facility] 04/09/2023, 12:54 PM   If 7PM-7AM, please contact night-coverage [email or web address]

## 2023-04-09 NOTE — Assessment & Plan Note (Signed)
Question of A.Fib / Flutter.  The EKGs in ED clearly show first an ectopic atrial tachycardia followed by an ectopic atrial rhythm, incorrectly interpreted by machine to be A.Flutter and A.Fib respectively. EDP however, seems to correctly interpret the EKG as ectopic atrial rhythm.  But then tone of note sounds like she has current A.Fib / Flutter. Not clear to me at this point if she had A.Fib/flutter when she initially presented to ED and just converted out of it before they could get an EKG? Or if it was always some sort of multifocal / ectopic atrial tachycardia? Secure chat sent to EDP that had her admitted to try and clarify. Remains on tele monitor Will hold off on further [facility] until we know one way or the other for sure. Day team may want to consult / curbside cards: if we remain unsure I presume a 30 day tele monitor may be the way to go.

## 2023-04-09 NOTE — H&P (Signed)
History and Physical    Patient: Tina [name] ZOX:[id number] DOB: 11-30-1950 DOA: 04/08/2023 DOS: the patient was seen and examined on 04/09/2023 PCP: System, Provider Not In  Patient coming from: Home  Chief Complaint:  Chief Complaint  Patient presents with   Leg Swelling   Shortness of Breath   HPI: Tina [name] is a 72 y.o. female with medical history significant of HTN, HLD.  Pt in to ED at [facility] with c/o leg swelling and SOB.  Pt with RLE swelling and ankle + foot pain for past week or so.  At birthday party tonight, became very SOB.  She states that she has been having some shortness of breath has been going on for about a week or so. Denies any fevers or sick contacts.   ED course: Pt may or may not have been in A.Fib prior to presentation to ED (this part unfortunately is not clear).  EKGs in ED appear to show an ectopic atrial rhythm that is being incorrectly interpreted by the machine as A.Fib and A.Flutter.  Regardless: pt given 10mg  IV cardizem, cardizem gtt then [facility] as her rate was in the normal range.  Pt given 40mg  IV lasix, imaging neg for PE but did show small B pleural effusions, cardiomegaly, and pulmonary nodular findings they recd follow up. Review of Systems: As mentioned in the history of present illness. All other systems reviewed and are negative. Past Medical History:  Diagnosis Date   Anxiety    Arthritis    bilateral hips , left hand    Depression    Headache(784.0)    occasional   Pneumonia    hx of    Past Surgical History:  Procedure Laterality Date   CERVICAL FUSION     anterior 7/12    TOTAL HIP ARTHROPLASTY  09/13/2011   Procedure: TOTAL HIP ARTHROPLASTY ANTERIOR APPROACH;  Surgeon: Shelda Pal, MD;  Location: [facility]S;  Service: Orthopedics;  Laterality: Right;   TOTAL HIP ARTHROPLASTY Left 07/14/2014   Procedure: LEFT TOTAL HIP ARTHROPLASTY ANTERIOR APPROACH;  Surgeon: Shelda Pal, MD;  Location: [facility];  Service: Orthopedics;   Laterality: Left;   Social History:  reports that she has been smoking cigarettes. She has a 11 pack-year smoking history. She has never used smokeless tobacco. She reports current alcohol use. She reports that she does not use drugs.  No Known Allergies  History reviewed. No pertinent family history.  Prior to Admission medications   Medication Sig Start Date End Date Taking? Authorizing Provider  aspirin EC 325 MG EC tablet Take 1 tablet (325 mg total) by mouth 2 (two) times daily. 07/16/14   Lanney Gins, PA-C  docusate sodium 100 MG CAPS Take 100 mg by mouth 2 (two) times daily. 07/16/14   Lanney Gins, PA-C  ferrous sulfate 325 (65 FE) MG tablet Take 1 tablet (325 mg total) by mouth 3 (three) times daily after meals. 07/16/14   Lanney Gins, PA-C  HYDROcodone-acetaminophen (NORCO) 7.5-325 MG per tablet Take 1-2 tablets by mouth every 4 (four) hours as needed for moderate pain. 07/16/14   Lanney Gins, PA-C  methocarbamol (ROBAXIN) 500 MG tablet Take 1 tablet (500 mg total) by mouth every 6 (six) hours as needed for muscle spasms. 07/16/14   Lanney Gins, PA-C  polyethylene glycol (MIRALAX / GLYCOLAX) packet Take 17 g by mouth 2 (two) times daily. 07/16/14   Lanney Gins, PA-C  sertraline (ZOLOFT) 100 MG tablet Take 200 mg by mouth every  morning.    [provider]  traZODone (DESYREL) 100 MG tablet Take 100 mg by mouth at bedtime.    [provider]    Physical Exam: Vitals:   04/09/23 0130 04/09/23 0200 04/09/23 0253 04/09/23 0522  BP:    (!) 140/84  Pulse: 68   83  Resp: 19   18  Temp:    98.2 F (36.8 C)  TempSrc:    Oral  SpO2: 97%   97%  Weight:   60.6 kg 60.6 kg  Height:  5\' 4"  (1.626 m)     Constitutional: NAD, calm, comfortable Respiratory: clear to auscultation bilaterally, no wheezing, no crackles. Normal respiratory effort. No accessory muscle use.  Cardiovascular: IRR, IRR Abdomen: no tenderness, no masses palpated. No hepatosplenomegaly.  Bowel sounds positive.  Musculoskeletal: R ankle swelling Neurologic: CN 2-12 grossly intact. Sensation intact, DTR normal. Strength 5/5 in all 4.  Psychiatric: Normal judgment and insight. Alert and oriented x 3. Normal mood.   Data Reviewed:    Labs on Admission: I have personally reviewed following labs and imaging studies  CBC: Recent Labs  Lab 04/08/23 2027  WBC 6.8  NEUTROABS 4.8  HGB 12.7  HCT 40.3  MCV 91.8  PLT 313   Basic Metabolic Panel: Recent Labs  Lab 04/08/23 2027  NA 136  K 3.7  CL 100  CO2 25  GLUCOSE 123*  BUN 18  CREATININE 1.02*  CALCIUM 8.9   GFR: Estimated Creatinine Clearance: 42.4 mL/min (A) (by C-G formula based on SCr of 1.02 mg/dL (H)). Liver Function Tests: Recent Labs  Lab 04/08/23 2027  AST 24  ALT 13  ALKPHOS 50  BILITOT 0.6  PROT 7.9  ALBUMIN 3.8   No results for input(s): "LIPASE", "AMYLASE" in the last 168 hours. No results for input(s): "AMMONIA" in the last 168 hours. Coagulation Profile: No results for input(s): "INR", "PROTIME" in the last 168 hours. Cardiac Enzymes: No results for input(s): "CKTOTAL", "CKMB", "CKMBINDEX", "TROPONINI" in the last 168 hours. BNP (last 3 results) No results for input(s): "PROBNP" in the last 8760 hours. HbA1C: No results for input(s): "HGBA1C" in the last 72 hours. CBG: No results for input(s): "GLUCAP" in the last 168 hours. Lipid Profile: No results for input(s): "CHOL", "HDL", "LDLCALC", "TRIG", "CHOLHDL", "LDLDIRECT" in the last 72 hours. Thyroid Function Tests: No results for input(s): "TSH", "T4TOTAL", "FREET4", "T3FREE", "THYROIDAB" in the last 72 hours. Anemia Panel: No results for input(s): "VITAMINB12", "FOLATE", "FERRITIN", "TIBC", "IRON", "RETICCTPCT" in the last 72 hours. Urine analysis:    Component Value Date/Time   COLORURINE YELLOW 07/07/2014 1351   APPEARANCEUR CLEAR 07/07/2014 1351   LABSPEC 1.016 07/07/2014 1351   PHURINE 5.0 07/07/2014 1351   GLUCOSEU  NEGATIVE 07/07/2014 1351   HGBUR NEGATIVE 07/07/2014 1351   BILIRUBINUR NEGATIVE 07/07/2014 1351   KETONESUR NEGATIVE 07/07/2014 1351   PROTEINUR NEGATIVE 07/07/2014 1351   UROBILINOGEN 0.2 07/07/2014 1351   NITRITE NEGATIVE 07/07/2014 1351   LEUKOCYTESUR NEGATIVE 07/07/2014 1351    Radiological Exams on Admission: CT Angio Chest PE W and/or Wo Contrast  Result Date: 04/08/2023 CLINICAL DATA:  Shortness of breath, labored breathing with exertion. EXAM: CT ANGIOGRAPHY CHEST WITH CONTRAST TECHNIQUE: Multidetector CT imaging of the chest was performed using the standard protocol during bolus administration of intravenous contrast. Multiplanar CT image reconstructions and MIPs were obtained to evaluate the vascular anatomy. RADIATION DOSE REDUCTION: This exam was performed according to the departmental dose-optimization program which includes automated exposure control, adjustment  of the mA and/or kV according to patient size and/or use of iterative reconstruction technique. CONTRAST:  75mL OMNIPAQUE IOHEXOL 350 MG/ML SOLN COMPARISON:  Chest x-ray today. FINDINGS: Cardiovascular: No filling defects in the pulmonary arteries to suggest pulmonary emboli. Cardiomegaly. Diffusely calcified coronary arteries. Moderate aortic calcifications. No evidence of aortic aneurysm. Small pericardial effusion. Densely calcified mitral valve annulus. Mediastinum/Nodes: Precarinal lymph node has a short axis diameter of 14 mm. Right hilar lymph node has a short axis diameter of 15 mm. No axillary or left hilar adenopathy. Trachea and esophagus are unremarkable. Thyroid unremarkable. Lungs/Pleura: Small bilateral pleural effusions. Dependent bibasilar atelectasis. Mild centrilobular emphysema. Nodular density in the left upper lobe measures 1.8 x 1.1 cm. Favor scarring, but this warrants follow-up. Nodular density in the lingula measuring up to 10 mm. Interstitial prominence peripherally, favor scarring/early fibrosis. Upper  Abdomen: No acute abnormality Musculoskeletal: Chest wall soft tissues are unremarkable. No acute bony abnormality. Review of the MIP images confirms the above findings. IMPRESSION: No evidence of pulmonary embolus. Cardiomegaly.  Diffuse coronary artery disease. Small bilateral pleural effusions, bibasilar and dependent atelectasis. Nodular opacities in the left lung. Non-contrast chest CT at 3-6 months is recommended. If the nodules are stable at time of repeat CT, then future CT at 18-24 months (from today's scan) is considered optional for low-risk patients, but is recommended for high-risk patients. This recommendation follows the consensus statement: Guidelines for Management of Incidental Pulmonary Nodules Detected on CT Images: From the Fleischner Society 2017; Radiology 2017; 284:228-243. Aortic Atherosclerosis (ICD10-I70.0) and Emphysema (ICD10-J43.9). Electronically Signed   By: Charlett Nose M.D.   On: 04/08/2023 22:24   DG Foot Complete Right  Result Date: 04/08/2023 CLINICAL DATA:  Right leg pain.  Ankle swelling.  No known injury. EXAM: RIGHT FOOT COMPLETE - 3+ VIEW COMPARISON:  None Available. 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 Old fracture fragment off the tip of the lateral malleolus which is well corticated. No acute fracture, subluxation or dislocation. Soft tissues are intact. IMPRESSION: No acute bony abnormality. Electronically Signed   By: Charlett Nose M.D.   On: 04/08/2023 21:49   DG Ankle Complete Right  Result Date: 04/08/2023 CLINICAL DATA:  Right ankle swelling, pain.  No known injury. EXAM: RIGHT ANKLE - COMPLETE 3+ VIEW COMPARISON:  None Available. FINDINGS: Old lateral malleolar fracture. No acute fracture, subluxation or dislocation. Mild spurring within the right ankle joint. Diffuse soft tissue swelling. IMPRESSION: No acute bony abnormality. Electronically Signed   By: Charlett Nose M.D.   On: 04/08/2023 21:48   DG Chest Port 1 View  Result Date:  04/08/2023 CLINICAL DATA:  Shortness of breath EXAM: PORTABLE CHEST 1 VIEW COMPARISON:  None Available. FINDINGS: Cardiomegaly. Diffuse interstitial prominence throughout the lungs may reflect interstitial edema. Small bilateral pleural effusions. No acute bony abnormality. IMPRESSION: Cardiomegaly with diffuse interstitial prominence, likely interstitial edema. Small bilateral pleural effusions. Electronically Signed   By: Charlett Nose M.D.   On: 04/08/2023 21:48   US Venous Img Lower Unilateral Right  Result Date: 04/08/2023 CLINICAL DATA:  Right leg swelling EXAM: RIGHT LOWER EXTREMITY VENOUS DOPPLER ULTRASOUND TECHNIQUE: Gray-scale sonography with graded compression, as well as color Doppler and duplex ultrasound were performed to evaluate the lower extremity deep venous systems from the level of the common femoral vein and including the common femoral, femoral, profunda femoral, popliteal and calf veins including the posterior tibial, peroneal and gastrocnemius veins when visible. The superficial great saphenous vein was also interrogated. Spectral  Doppler was utilized to evaluate flow at rest and with distal augmentation maneuvers in the common femoral, femoral and popliteal veins. COMPARISON:  None Available. FINDINGS: Contralateral Common Femoral Vein: Respiratory phasicity is normal and symmetric with the symptomatic side. No evidence of thrombus. Normal compressibility. Common Femoral Vein: No evidence of thrombus. Normal compressibility, respiratory phasicity and response to augmentation. Saphenofemoral Junction: No evidence of thrombus. Normal compressibility and flow on color Doppler imaging. Profunda Femoral Vein: No evidence of thrombus. Normal compressibility and flow on color Doppler imaging. Femoral Vein: Not well visualized distally due to overlying edema. Popliteal Vein: No evidence of thrombus. Normal compressibility, respiratory phasicity and response to augmentation. Calf Veins: Limited  visualization due to overlying edema. Superficial Great Saphenous Vein: No evidence of thrombus. Normal compressibility. Venous Reflux:  None. Other Findings:  None. IMPRESSION: Somewhat limited exam to overlying edema and patient discomfort. No definitive thrombus is noted. Electronically Signed   By: Alcide Clever M.D.   On: 04/08/2023 21:22    EKG: Independently reviewed.   Assessment and Plan: * Ectopic atrial tachycardia Question of A.Fib / Flutter.  The EKGs in ED clearly show first an ectopic atrial tachycardia followed by an ectopic atrial rhythm, incorrectly interpreted by machine to be A.Flutter and A.Fib respectively. EDP however, seems to correctly interpret the EKG as ectopic atrial rhythm.  But then tone of note sounds like she has current A.Fib / Flutter. Not clear to me at this point if she had A.Fib/flutter when she initially presented to ED and just converted out of it before they could get an EKG? Or if it was always some sort of multifocal / ectopic atrial tachycardia? Secure chat sent to EDP that had her admitted to try and clarify. Remains on tele monitor Will hold off on further [facility] until we know one way or the other for sure. Day team may want to consult / curbside cards: if we remain unsure I presume a 30 day tele monitor may be the way to go.  New onset of congestive heart failure (HCC) DOE, abnormal heart rhythm, BNP elevation, B pleural effusions. Suspicious for new onset CHF. CHF pathway Tele monitor 2d echo ordered and pending Got lasix 40mg  IV x1 in ED, will continue 40mg  IV daily for the moment. RLE swelling but not really peripheral edema Getting DVT US of RLE.  Pulmonary nodules Follow up CT chest in 3-6 months.      Advance Care Planning:   Code Status: Full Code  Consults: None  Family Communication: No family in room  Severity of Illness: The appropriate patient status for this patient is OBSERVATION. Observation status is judged to be  reasonable and necessary in order to provide the required intensity of service to ensure the patient's safety. The patient's presenting symptoms, physical exam findings, and initial radiographic and laboratory data in the context of their medical condition is felt to place them at decreased risk for further clinical deterioration. Furthermore, it is anticipated that the patient will be medically stable for discharge from the hospital within 2 midnights of admission.   Author: Hillary Bow., DO 04/09/2023 6:16 AM  For on call review [email or web address].

## 2023-04-09 NOTE — Plan of Care (Signed)
  Problem: Clinical Measurements: Goal: Diagnostic test results will improve Outcome: Progressing   

## 2023-04-09 NOTE — Progress Notes (Signed)
SATURATION QUALIFICATIONS: (This note is used to comply with regulatory documentation for home oxygen)  Patient Saturations on Room Air at Rest = 92%  Patient Saturations on Room Air while Ambulating = 85%  Patient Saturations on 2 Liters of oxygen while Ambulating = 91%  Please briefly explain why patient needs home oxygen: 

## 2023-04-09 NOTE — Plan of Care (Signed)
Plan of Care Note for accepted transfer   Patient name: Tina [name] ZOX:[id number] DOB: 30-Jul-1950  Facility requesting transfer: [facility] Requesting Provider: Dr. Pilar Plate Facility course: 72 year old female with history of hypertension, hyperlipidemia, anxiety, depression, arthritis, tobacco use presented to ED with complaints of shortness of breath and right leg swelling.  No chest pain.  Found to be in new onset A-fib/flutter with RVR, heart rate initially 110-120s.  Oxygen saturation 90% on room air and placed on 2 L nasal cannula.  BNP 722, troponin 26> 30.  X-rays of right foot/ankle negative for acute abnormality.    CTA chest: No evidence of pulmonary embolus.   Cardiomegaly.  Diffuse coronary artery disease.   Small bilateral pleural effusions, bibasilar and dependent atelectasis.   Nodular opacities in the left lung. Non-contrast chest CT at 3-6 months is recommended. If the nodules are stable at time of repeat CT, then future CT at 18-24 months (from today's scan) is considered optional for low-risk patients, but is recommended for high-risk patients. This recommendation follows the consensus statement: Guidelines for Management of Incidental Pulmonary Nodules Detected on CT Images: From the Fleischner Society 2017; Radiology 2017; 284:228-243.   Aortic Atherosclerosis (ICD10-I70.0) and Emphysema (ICD10-J43.9).   Patient was given Eliquis, IV Lasix 40 mg, and started on Cardizem drip.  Plan of care: The patient is accepted for admission to Progressive unit at [facility].  [facility] will assume care on arrival to accepting facility. Until arrival, care as per EDP. However, [facility] available 24/7 for questions and assistance.  Check [email or web address] for on-call coverage.  Nursing staff, please call [facility] Admits & Consults System-Wide number under Amion on patient's arrival so appropriate admitting provider can evaluate the pt.

## 2023-04-09 NOTE — Hospital Course (Signed)
Tina [name] is a 72 y.o. female with a history of hypertension, hyperlipidemia.  Patient presented secondary to leg swelling and shortness of breath with concern for acute heart failure in addition to atrial fibrillation with RVR converted to sinus rhythm after Cardizem. Patient started on Lasix IV with some improvement of symptoms. Supplemental oxygen provided for associated hypoxia. Transthoracic Echocardiogram obtained and significant for mitral valve stenosis. Cardiology consulted.

## 2023-04-09 NOTE — Assessment & Plan Note (Signed)
Follow up CT chest in 3-6 months. Smoking cessation counseling.  CT chest with emphysematous and fibrotic changes, will need outpatient PFT and possible a high resolution CT chest.

## 2023-04-09 NOTE — ED Notes (Signed)
[facility] called for transport. 

## 2023-04-09 NOTE — Assessment & Plan Note (Signed)
DOE, abnormal heart rhythm, BNP elevation, B pleural effusions. Suspicious for new onset CHF. CHF pathway Tele monitor 2d echo ordered and pending Got lasix 40mg  IV x1 in ED, will continue 40mg  IV daily for the moment. RLE swelling but not really peripheral edema Getting DVT US of RLE.

## 2023-04-09 NOTE — Progress Notes (Signed)
Attempted Echocardiogram, patient care in progress will attempt later.

## 2023-04-09 NOTE — Consult Note (Addendum)
Cardiology Consultation:   Patient ID: Tina [name] MRN: [id number]; DOB: 1950/08/16  Admit date: 04/08/2023 Date of Consult: 04/09/2023  Primary Care Provider: System, Provider Not In   Patient Profile:   Tina [name] is a 72 y.o. female with a hx of HTN, HLD who is being seen today for the evaluation of mitral stenosis and hypoxic respiratory failure at the request of Dr. Caleb Popp.  History of Present Illness:   Ms. Tina [name] is a 72 yo F with a history of HTN and HLD who presented on 9/28 for acute heart failure and afib with RVR. Cardiology is consulted for new diagnosis of moderate to severe mitral stenosis on ECHO from 9/29.  On my examination, patient reports she is doing well. She is currently on 2L [place] but [place] is not in her nose and she is not SOB. She is currently in atrial fibrillation with HR 90s - 120s. She reports that she started feeling extremely short of breath on 9/27 while at a birthday party with her relatives. She says this episode happened "out of the blue" but says she may have been feeling progressively SOB for the past week. At the time of event, she denied chest pain or palpitations. She was transferred to the ER where she was found to be in afib w/ RVR and given Cardizem with reported conversion to NSR.   In the ER, she was started on IV lasix 40 every day for ADHF with some improvement of symptoms. ECHO today showed LVEF 65-70%, moderate LVH, RV mildly reduced, RVSP 40, moderate to severe MS (mean gradient 9) with severe MAC and dilated IVC with >50% respiratory variability suggesting RAP of 8 mmHg. Her BP is currently 121/68, HR 113. She is saturating 96% on 2L . Since admission, she has been diuresed with IV lasix 40 mg x2 and is net -1L. Given her ongoing hypoxic respiratory failure, cardiology was consulted for management of volume and mitral stenosis.  With regards to cardiac history, pt reports she has a history of "heart attack" in [place], but has not  had a prior stent or change in medications after the episode. She does not carry a prior diagnosis of atrial fibrillation or mitral stenosis. Her only home medications are zocor 10 mg every day (new medication), metoprolol 25mg , trazodone and sertraline. Normally at home, she reports she is mostly sedentary and gets around with a walker. Normally she is able to walk ~50 feet without getting SOB, but otherwise does not do much activity. She is a former smoker. She carries a diagnosis of HTN and HLD.  Past Medical History:  Diagnosis Date   Anxiety    Arthritis    bilateral hips , left hand    Depression    Headache(784.0)    occasional   Pneumonia    hx of     Past Surgical History:  Procedure Laterality Date   CERVICAL FUSION     anterior 7/12    TOTAL HIP ARTHROPLASTY  09/13/2011   Procedure: TOTAL HIP ARTHROPLASTY ANTERIOR APPROACH;  Surgeon: Shelda Pal, MD;  Location: [facility];  Service: Orthopedics;  Laterality: Right;   TOTAL HIP ARTHROPLASTY Left 07/14/2014   Procedure: LEFT TOTAL HIP ARTHROPLASTY ANTERIOR APPROACH;  Surgeon: Shelda Pal, MD;  Location: [facility];  Service: Orthopedics;  Laterality: Left;     Home Medications:  Prior to Admission medications   Medication Sig Start Date End Date Taking? Authorizing Provider  metoprolol succinate (TOPROL-XL) 25 MG 24  hr tablet Take 25 mg by mouth daily. 09/09/22  Yes [provider]  sertraline (ZOLOFT) 50 MG tablet Take 50 mg by mouth daily. 09/09/22  Yes [provider]  traZODone (DESYREL) 100 MG tablet Take 100 mg by mouth at bedtime.   Yes [provider]    Inpatient Medications: Scheduled Meds:  enoxaparin (LOVENOX) injection  40 mg Subcutaneous Q24H   furosemide  40 mg Intravenous Daily   [START ON 04/10/2023] influenza vaccine adjuvanted  0.5 mL Intramuscular Tomorrow-1000   [START ON 04/10/2023] pneumococcal 20-valent conjugate vaccine  0.5 mL Intramuscular Tomorrow-1000   sodium chloride flush   3 mL Intravenous Q12H   Continuous Infusions:  sodium chloride     PRN Meds: sodium chloride, acetaminophen, iohexol, ondansetron (ZOFRAN) IV, sodium chloride flush  Allergies:   No Known Allergies  Social History:   Social History   Socioeconomic History   Marital status: Divorced    Spouse name: Not on file   Number of children: Not on file   Years of education: Not on file   Highest education level: Not on file  Occupational History   Not on file  Tobacco Use   Smoking status: Every Day    Current packs/day: 0.25    Average packs/day: 0.3 packs/day for 44.0 years (11.0 ttl pk-yrs)    Types: Cigarettes   Smokeless tobacco: Never  Substance and Sexual Activity   Alcohol use: Yes    Comment: occasional beer    Drug use: No    Comment: hx of marijuana   Sexual activity: Not on file  Other Topics Concern   Not on file  Social History Narrative   Not on file   Social Determinants of Health   Financial Resource Strain: Not on file  Food Insecurity: No Food Insecurity (04/09/2023)   Hunger Vital Sign    Worried About Running Out of Food in the Last Year: Never true    Ran Out of Food in the Last Year: Never true  Transportation Needs: No Transportation Needs (04/09/2023)   PRAPARE - Administrator, Civil Service (Medical): No    Lack of Transportation (Non-Medical): No  Physical Activity: Not on file  Stress: Not on file  Social Connections: Not on file  Intimate Partner Violence: Not At Risk (04/09/2023)   Humiliation, Afraid, Rape, and Kick questionnaire    Fear of Current or Ex-Partner: No    Emotionally Abused: No    Physically Abused: No    Sexually Abused: No    Family History:   History reviewed. No pertinent family history.    Physical Exam/Data:   Vitals:   04/09/23 0522 04/09/23 0809 04/09/23 1200 04/09/23 1600  BP: (!) 140/84 128/66 119/71 121/68  Pulse: 83 80 (!) 110 73  Resp: 18 20 19    Temp: 98.2 F (36.8 C) 98 F (36.7 C) 97.9  F (36.6 C) 98.1 F (36.7 C)  TempSrc: Oral Oral Oral Oral  SpO2: 97% 95% 95% 96%  Weight: 60.6 kg     Height:        Intake/Output Summary (Last 24 hours) at 04/09/2023 1809 Last data filed at 04/09/2023 1258 Gross per 24 hour  Intake 134.13 ml  Output 2150 ml  Net -2015.87 ml   Filed Weights   04/08/23 1945 04/09/23 0253 04/09/23 0522  Weight: 63.5 kg 60.6 kg 60.6 kg   Body mass index is 22.93 kg/m.  General:  elderly, well-nourished HEENT: normal Lymph: no adenopathy Neck:  JVD elevated Endocrine:  No thryomegaly Vascular: No carotid bruits; FA pulses 2+ bilaterally without bruits  Cardiac:  irregularly irregular, diastolic murmur 3/6 Lungs:  1+ b/l rales Abd: soft, nontender, no hepatomegaly  Ext: no edema Musculoskeletal:  No deformities, BUE and BLE strength normal and equal Skin: warm and dry  Neuro:  CNs 2-12 intact, no focal abnormalities noted Psych:  Normal affect   Telemetry:  Telemetry was personally reviewed and demonstrates:  atrial fibrillation HR 90s-120s  Relevant CV Studies: ECHO 9/29  1. Left ventricular ejection fraction, by estimation, is 65 to 70%. The  left ventricle has normal function. The left ventricle has no regional  wall motion abnormalities. There is moderate left ventricular hypertrophy.  Left ventricular diastolic  parameters are indeterminate.   2. Right ventricular systolic function is mildly reduced. The right  ventricular size is normal. There is mildly elevated pulmonary artery  systolic pressure. The estimated right ventricular systolic pressure is  40.0 mmHg.   3. Left atrial size was moderately dilated.   4. The mitral valve is degenerative. Trivial mitral valve regurgitation.  Moderate to severe mitral stenosis. The mean mitral valve gradient is 9.0  mmHg with average heart rate of 84 bpm. Severe mitral annular  calcification.   5. The aortic valve is tricuspid. Aortic valve regurgitation is not  visualized. Aortic valve  sclerosis/calcification is present, without any  evidence of aortic stenosis.   6. The inferior vena cava is dilated in size with >50% respiratory  variability, suggesting right atrial pressure of 8 mmHg.   Laboratory Data:  Chemistry Recent Labs  Lab 04/08/23 2027  NA 136  K 3.7  CL 100  CO2 25  GLUCOSE 123*  BUN 18  CREATININE 1.02*  CALCIUM 8.9  GFRNONAA 58*  ANIONGAP 11    Recent Labs  Lab 04/08/23 2027  PROT 7.9  ALBUMIN 3.8  AST 24  ALT 13  ALKPHOS 50  BILITOT 0.6   Hematology Recent Labs  Lab 04/08/23 2027  WBC 6.8  RBC 4.39  HGB 12.7  HCT 40.3  MCV 91.8  MCH 28.9  MCHC 31.5  RDW 15.1  PLT 313   Cardiac EnzymesNo results for input(s): "TROPONINI" in the last 168 hours. No results for input(s): "TROPIPOC" in the last 168 hours.  BNP Recent Labs  Lab 04/08/23 2027  BNP 722.8*    DDimer No results for input(s): "DDIMER" in the last 168 hours.  Radiology/Studies:  ECHOCARDIOGRAM COMPLETE  Result Date: 04/09/2023    ECHOCARDIOGRAM REPORT   Patient Name:   MAURICA OMURA Date of Exam: 04/09/2023 Medical Rec #:  [id number]          Height:       64.0 in Accession #:    [id number]         Weight:       133.6 lb Date of Birth:  04/07/1951           BSA:          1.648 m Patient Age:    72 years           BP:           140/84 mmHg Patient Gender: F                  HR:           88 bpm. Exam Location:  Inpatient Procedure: 2D Echo, Cardiac Doppler and Color Doppler Indications:    CHF-Acute Diastolic  I50.31, Abnormal ECG R94.31  History:        Patient has no prior history of Echocardiogram examinations.                 CHF; Arrythmias:Tachycardia.  Sonographer:    Lucendia Herrlich RCS Referring Phys: [phone] JARED M GARDNER IMPRESSIONS  1. Left ventricular ejection fraction, by estimation, is 65 to 70%. The left ventricle has normal function. The left ventricle has no regional wall motion abnormalities. There is moderate left ventricular hypertrophy. Left  ventricular diastolic parameters are indeterminate.  2. Right ventricular systolic function is mildly reduced. The right ventricular size is normal. There is mildly elevated pulmonary artery systolic pressure. The estimated right ventricular systolic pressure is 40.0 mmHg.  3. Left atrial size was moderately dilated.  4. The mitral valve is degenerative. Trivial mitral valve regurgitation. Moderate to severe mitral stenosis. The mean mitral valve gradient is 9.0 mmHg with average heart rate of 84 bpm. Severe mitral annular calcification.  5. The aortic valve is tricuspid. Aortic valve regurgitation is not visualized. Aortic valve sclerosis/calcification is present, without any evidence of aortic stenosis.  6. The inferior vena cava is dilated in size with >50% respiratory variability, suggesting right atrial pressure of 8 mmHg. FINDINGS  Left Ventricle: Left ventricular ejection fraction, by estimation, is 65 to 70%. The left ventricle has normal function. The left ventricle has no regional wall motion abnormalities. The left ventricular internal cavity size was normal in size. There is  moderate left ventricular hypertrophy. Left ventricular diastolic parameters are indeterminate. Right Ventricle: The right ventricular size is normal. Right vetricular wall thickness was not well visualized. Right ventricular systolic function is mildly reduced. There is mildly elevated pulmonary artery systolic pressure. The tricuspid regurgitant velocity is 2.83 m/s, and with an assumed right atrial pressure of 8 mmHg, the estimated right ventricular systolic pressure is 40.0 mmHg. Left [facility]: Left atrial size was moderately dilated. Right [facility]: Right atrial size was normal in size. Pericardium: Trivial pericardial effusion is present. Mitral Valve: The mitral valve is degenerative in appearance. Severe mitral annular calcification. Trivial mitral valve regurgitation. Moderate to severe mitral valve stenosis. MV peak gradient,  13.1 mmHg. The mean mitral valve gradient is 9.0 mmHg with average heart rate of 84 bpm. Tricuspid Valve: The tricuspid valve is normal in structure. Tricuspid valve regurgitation is trivial. Aortic Valve: The aortic valve is tricuspid. Aortic valve regurgitation is not visualized. Aortic valve sclerosis/calcification is present, without any evidence of aortic stenosis. Aortic valve mean gradient measures 3.0 mmHg. Aortic valve peak gradient measures 5.4 mmHg. Aortic valve area, by VTI measures 1.76 cm. Pulmonic Valve: The pulmonic valve was not well visualized. Pulmonic valve regurgitation is trivial. Aorta: The aortic root and ascending aorta are structurally normal, with no evidence of dilitation. Venous: The inferior vena cava is dilated in size with greater than 50% respiratory variability, suggesting right atrial pressure of 8 mmHg. IAS/Shunts: The interatrial septum was not well visualized.  LEFT VENTRICLE PLAX 2D LVIDd:         4.90 cm   Diastology LVIDs:         3.50 cm   LV e' medial:    5.35 cm/s LV PW:         1.50 cm   LV E/e' medial:  31.4 LV IVS:        1.30 cm   LV e' lateral:   5.51 cm/s LVOT diam:     1.80 cm   LV E/e'  lateral: 30.5 LV SV:         38 LV SV Index:   23 LVOT Area:     2.54 cm  RIGHT VENTRICLE            IVC RV S prime:     8.51 cm/s  IVC diam: 2.20 cm TAPSE (M-mode): 1.6 cm LEFT ATRIUM             Index        RIGHT ATRIUM           Index LA diam:        4.90 cm 2.97 cm/m   RA Area:     12.10 cm LA Vol (A2C):   65.3 ml 39.62 ml/m  RA Volume:   21.90 ml  13.29 ml/m LA Vol (A4C):   61.0 ml 37.01 ml/m LA Biplane Vol: 67.9 ml 41.20 ml/m  AORTIC VALVE AV Area (Vmax):    2.00 cm AV Area (Vmean):   1.83 cm AV Area (VTI):     1.76 cm AV Vmax:           116.00 cm/s AV Vmean:          76.600 cm/s AV VTI:            0.215 m AV Peak Grad:      5.4 mmHg AV Mean Grad:      3.0 mmHg LVOT Vmax:         91.07 cm/s LVOT Vmean:        55.133 cm/s LVOT VTI:          0.149 m LVOT/AV VTI ratio:  0.69  AORTA Ao Root diam: 2.70 cm Ao Asc diam:  2.90 cm MITRAL VALVE                TRICUSPID VALVE MV Area (PHT): 3.77 cm     TR Peak grad:   32.0 mmHg MV Area VTI:   0.56 cm     TR Vmax:        283.00 cm/s MV Peak grad:  13.1 mmHg MV Mean grad:  9.0 mmHg     SHUNTS MV Vmax:       1.81 m/s     Systemic VTI:  0.15 m MV Vmean:      133.5 cm/s   Systemic Diam: 1.80 cm MV Decel Time: 201 msec MR Peak grad: 48.7 mmHg MR Vmax:      349.00 cm/s MV E velocity: 168.00 cm/s MV A velocity: 199.00 cm/s MV E/A ratio:  0.84 Epifanio Lesches MD Electronically signed by Epifanio Lesches MD Signature Date/Time: 04/09/2023/3:09:16 PM    Final    CT Angio Chest PE W and/or Wo Contrast  Result Date: 04/08/2023 CLINICAL DATA:  Shortness of breath, labored breathing with exertion. EXAM: CT ANGIOGRAPHY CHEST WITH CONTRAST TECHNIQUE: Multidetector CT imaging of the chest was performed using the standard protocol during bolus administration of intravenous contrast. Multiplanar CT image reconstructions and MIPs were obtained to evaluate the vascular anatomy. RADIATION DOSE REDUCTION: This exam was performed according to the departmental dose-optimization program which includes automated exposure control, adjustment of the mA and/or kV according to patient size and/or use of iterative reconstruction technique. CONTRAST:  75mL OMNIPAQUE IOHEXOL 350 MG/ML SOLN COMPARISON:  Chest x-ray today. FINDINGS: Cardiovascular: No filling defects in the pulmonary arteries to suggest pulmonary emboli. Cardiomegaly. Diffusely calcified coronary arteries. Moderate aortic calcifications. No evidence of aortic aneurysm. Small pericardial effusion. Densely calcified mitral valve annulus. Mediastinum/Nodes:  Precarinal lymph node has a short axis diameter of 14 mm. Right hilar lymph node has a short axis diameter of 15 mm. No axillary or left hilar adenopathy. Trachea and esophagus are unremarkable. Thyroid unremarkable. Lungs/Pleura: Small  bilateral pleural effusions. Dependent bibasilar atelectasis. Mild centrilobular emphysema. Nodular density in the left upper lobe measures 1.8 x 1.1 cm. Favor scarring, but this warrants follow-up. Nodular density in the lingula measuring up to 10 mm. Interstitial prominence peripherally, favor scarring/early fibrosis. Upper Abdomen: No acute abnormality Musculoskeletal: Chest wall soft tissues are unremarkable. No acute bony abnormality. Review of the MIP images confirms the above findings. IMPRESSION: No evidence of pulmonary embolus. Cardiomegaly.  Diffuse coronary artery disease. Small bilateral pleural effusions, bibasilar and dependent atelectasis. Nodular opacities in the left lung. Non-contrast chest CT at 3-6 months is recommended. If the nodules are stable at time of repeat CT, then future CT at 18-24 months (from today's scan) is considered optional for low-risk patients, but is recommended for high-risk patients. This recommendation follows the consensus statement: Guidelines for Management of Incidental Pulmonary Nodules Detected on CT Images: From the Fleischner Society 2017; Radiology 2017; 284:228-243. Aortic Atherosclerosis (ICD10-I70.0) and Emphysema (ICD10-J43.9). Electronically Signed   By: Charlett Nose M.D.   On: 04/08/2023 22:24   DG Foot Complete Right  Result Date: 04/08/2023 CLINICAL DATA:  Right leg pain.  Ankle swelling.  No known injury. EXAM: RIGHT FOOT COMPLETE - 3+ VIEW COMPARISON:  None Available. 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 Old fracture fragment off the tip of the lateral malleolus which is well corticated. No acute fracture, subluxation or dislocation. Soft tissues are intact. IMPRESSION: No acute bony abnormality. Electronically Signed   By: Charlett Nose M.D.   On: 04/08/2023 21:49   DG Ankle Complete Right  Result Date: 04/08/2023 CLINICAL DATA:  Right ankle swelling, pain.  No known injury. EXAM: RIGHT ANKLE - COMPLETE 3+ VIEW COMPARISON:   None Available. FINDINGS: Old lateral malleolar fracture. No acute fracture, subluxation or dislocation. Mild spurring within the right ankle joint. Diffuse soft tissue swelling. IMPRESSION: No acute bony abnormality. Electronically Signed   By: Charlett Nose M.D.   On: 04/08/2023 21:48   DG Chest Port 1 View  Result Date: 04/08/2023 CLINICAL DATA:  Shortness of breath EXAM: PORTABLE CHEST 1 VIEW COMPARISON:  None Available. FINDINGS: Cardiomegaly. Diffuse interstitial prominence throughout the lungs may reflect interstitial edema. Small bilateral pleural effusions. No acute bony abnormality. IMPRESSION: Cardiomegaly with diffuse interstitial prominence, likely interstitial edema. Small bilateral pleural effusions. Electronically Signed   By: Charlett Nose M.D.   On: 04/08/2023 21:48   US Venous Img Lower Unilateral Right  Result Date: 04/08/2023 CLINICAL DATA:  Right leg swelling EXAM: RIGHT LOWER EXTREMITY VENOUS DOPPLER ULTRASOUND TECHNIQUE: Gray-scale sonography with graded compression, as well as color Doppler and duplex ultrasound were performed to evaluate the lower extremity deep venous systems from the level of the common femoral vein and including the common femoral, femoral, profunda femoral, popliteal and calf veins including the posterior tibial, peroneal and gastrocnemius veins when visible. The superficial great saphenous vein was also interrogated. Spectral Doppler was utilized to evaluate flow at rest and with distal augmentation maneuvers in the common femoral, femoral and popliteal veins. COMPARISON:  None Available. FINDINGS: Contralateral Common Femoral Vein: Respiratory phasicity is normal and symmetric with the symptomatic side. No evidence of thrombus. Normal compressibility. Common Femoral Vein: No evidence of thrombus. Normal compressibility, respiratory phasicity and response to augmentation. Saphenofemoral Junction:  No evidence of thrombus. Normal compressibility and flow on color  Doppler imaging. Profunda Femoral Vein: No evidence of thrombus. Normal compressibility and flow on color Doppler imaging. Femoral Vein: Not well visualized distally due to overlying edema. Popliteal Vein: No evidence of thrombus. Normal compressibility, respiratory phasicity and response to augmentation. Calf Veins: Limited visualization due to overlying edema. Superficial Great Saphenous Vein: No evidence of thrombus. Normal compressibility. Venous Reflux:  None. Other Findings:  None. IMPRESSION: Somewhat limited exam to overlying edema and patient discomfort. No definitive thrombus is noted. Electronically Signed   By: Alcide Clever M.D.   On: 04/08/2023 21:22    Assessment and Plan:  Ms. Call is a 72 yo F with a history of HTN and HLD who presented on 9/29 for acute heart failure and afib with RVR. Cardiology is consulted for new diagnosis of moderate to severe mitral stenosis on ECHO from 9/29.  #. Acute hypoxic respiratory failure 2/2 pulmonary edema #. Acute decompensated heart failure with preserved ejection fraction #. Moderate to severe mitral stenosis Pt was admitted with ADHF in the setting of atrial fibrillation with RVR. She presented with hypoxic respiratory failure likely 2/2 pulmonary edema and has been diuresed with IV lasix 40 x2. Her symptoms have improved with diuresis but she still has some shortness of breath.  - On ECHO 9/29 she was found to have moderate-severe MS with mean gradient 9 and severe MAC.  - With regards to mitral stenosis and atrial fibrillation, she will need a TEE to rule out LA thrombus and better characterize MV in case intervention is warranted.  Recommendations: - Gentle diuresis with IV lasix 40 mg BID today - Cardiology AM team to evaluate respiratory status to determine ideal time for TEE (inpatient vs outpatient) +/- cardioversion if she is still in atrial fibrillation  #. Paroxysmal Atrial fibrillation vs MAT  Presented with Afib that reportedly  converted to NSR with cardizem. Most updated EKG with irregular rhythm that could be atrial fibrillation vs MAT (3 different p-wave morphologies) vs NSR with PACs.  She has never been diagnosed with Afib before. Risk factors could be ADHF and mitral stenosis with LA dilation. CHADSVASC 3 (female, HTN, age) - Start apixaban 5 mg BID (weight 60 kg, Cr <1.5, age <56) for [facility] - On home metoprolol 25 mg XL. Would recommend metoprolol tartrate 12.5 mg BID so this can be uptitrated if necessary  #. HLD Per [facility] records, on simvastatin 10 mg every day - continue simvastatin 10  #. HTN Per [facility] records, unclear if she has white coat hypertension or essential HTN - CTM blood pressure  For questions or updates, please contact [facility] Please consult [email or web address] for contact info under    Signed, Willette Alma MD MPH [facility] Cardiology 04/09/2023 6:09 PM

## 2023-04-09 NOTE — Progress Notes (Signed)
Echocardiogram 2D Echocardiogram has been performed.  Tina [name] 04/09/2023, 2:15 PM

## 2023-04-10 DIAGNOSIS — Z79899 Other long term (current) drug therapy: Secondary | ICD-10-CM | POA: Diagnosis not present

## 2023-04-10 DIAGNOSIS — J432 Centrilobular emphysema: Secondary | ICD-10-CM | POA: Diagnosis present

## 2023-04-10 DIAGNOSIS — Z981 Arthrodesis status: Secondary | ICD-10-CM | POA: Diagnosis not present

## 2023-04-10 DIAGNOSIS — M7989 Other specified soft tissue disorders: Secondary | ICD-10-CM | POA: Diagnosis present

## 2023-04-10 DIAGNOSIS — I110 Hypertensive heart disease with heart failure: Secondary | ICD-10-CM | POA: Diagnosis present

## 2023-04-10 DIAGNOSIS — J9601 Acute respiratory failure with hypoxia: Secondary | ICD-10-CM | POA: Diagnosis present

## 2023-04-10 DIAGNOSIS — I700 Atherosclerosis of aorta: Secondary | ICD-10-CM | POA: Diagnosis present

## 2023-04-10 DIAGNOSIS — I509 Heart failure, unspecified: Secondary | ICD-10-CM | POA: Diagnosis not present

## 2023-04-10 DIAGNOSIS — I252 Old myocardial infarction: Secondary | ICD-10-CM | POA: Diagnosis not present

## 2023-04-10 DIAGNOSIS — Z23 Encounter for immunization: Secondary | ICD-10-CM | POA: Diagnosis not present

## 2023-04-10 DIAGNOSIS — E785 Hyperlipidemia, unspecified: Secondary | ICD-10-CM | POA: Diagnosis present

## 2023-04-10 DIAGNOSIS — I342 Nonrheumatic mitral (valve) stenosis: Secondary | ICD-10-CM | POA: Diagnosis not present

## 2023-04-10 DIAGNOSIS — I5033 Acute on chronic diastolic (congestive) heart failure: Secondary | ICD-10-CM | POA: Diagnosis present

## 2023-04-10 DIAGNOSIS — I050 Rheumatic mitral stenosis: Secondary | ICD-10-CM

## 2023-04-10 DIAGNOSIS — I340 Nonrheumatic mitral (valve) insufficiency: Secondary | ICD-10-CM | POA: Diagnosis not present

## 2023-04-10 DIAGNOSIS — Z87891 Personal history of nicotine dependence: Secondary | ICD-10-CM | POA: Diagnosis not present

## 2023-04-10 DIAGNOSIS — F419 Anxiety disorder, unspecified: Secondary | ICD-10-CM | POA: Diagnosis present

## 2023-04-10 DIAGNOSIS — E876 Hypokalemia: Secondary | ICD-10-CM | POA: Diagnosis present

## 2023-04-10 DIAGNOSIS — I4719 Other supraventricular tachycardia: Secondary | ICD-10-CM | POA: Diagnosis present

## 2023-04-10 DIAGNOSIS — I4891 Unspecified atrial fibrillation: Principal | ICD-10-CM | POA: Diagnosis present

## 2023-04-10 DIAGNOSIS — F32A Depression, unspecified: Secondary | ICD-10-CM | POA: Diagnosis present

## 2023-04-10 DIAGNOSIS — Z96643 Presence of artificial hip joint, bilateral: Secondary | ICD-10-CM | POA: Diagnosis present

## 2023-04-10 DIAGNOSIS — I5031 Acute diastolic (congestive) heart failure: Secondary | ICD-10-CM

## 2023-04-10 DIAGNOSIS — R918 Other nonspecific abnormal finding of lung field: Secondary | ICD-10-CM | POA: Diagnosis not present

## 2023-04-10 DIAGNOSIS — R0602 Shortness of breath: Secondary | ICD-10-CM | POA: Diagnosis present

## 2023-04-10 DIAGNOSIS — I38 Endocarditis, valve unspecified: Secondary | ICD-10-CM

## 2023-04-10 DIAGNOSIS — Z7982 Long term (current) use of aspirin: Secondary | ICD-10-CM | POA: Diagnosis not present

## 2023-04-10 DIAGNOSIS — I272 Pulmonary hypertension, unspecified: Secondary | ICD-10-CM | POA: Diagnosis present

## 2023-04-10 DIAGNOSIS — I480 Paroxysmal atrial fibrillation: Secondary | ICD-10-CM | POA: Diagnosis present

## 2023-04-10 DIAGNOSIS — I2510 Atherosclerotic heart disease of native coronary artery without angina pectoris: Secondary | ICD-10-CM | POA: Diagnosis present

## 2023-04-10 DIAGNOSIS — I058 Other rheumatic mitral valve diseases: Secondary | ICD-10-CM | POA: Diagnosis present

## 2023-04-10 LAB — BASIC METABOLIC PANEL
Anion gap: 11 (ref 5–15)
BUN: 17 mg/dL (ref 8–23)
CO2: 26 mmol/L (ref 22–32)
Calcium: 8.4 mg/dL — ABNORMAL LOW (ref 8.9–10.3)
Chloride: 98 mmol/L (ref 98–111)
Creatinine, Ser: 1.11 mg/dL — ABNORMAL HIGH (ref 0.44–1.00)
GFR, Estimated: 52 mL/min — ABNORMAL LOW (ref >60)
Glucose, Bld: 88 mg/dL (ref 70–99)
Potassium: 3.1 mmol/L — ABNORMAL LOW (ref 3.5–5.1)
Sodium: 135 mmol/L (ref 135–145)

## 2023-04-10 LAB — MAGNESIUM: Magnesium: 1.9 mg/dL (ref 1.7–2.4)

## 2023-04-10 MED ORDER — ASPIRIN 81 MG PO CHEW
81.0000 mg | CHEWABLE_TABLET | ORAL | Status: AC
  Administered 2023-04-11: 81 mg via ORAL
  Filled 2023-04-10: qty 1

## 2023-04-10 MED ORDER — POTASSIUM CHLORIDE CRYS ER 20 MEQ PO TBCR
40.0000 meq | EXTENDED_RELEASE_TABLET | ORAL | Status: AC
  Administered 2023-04-10 (×2): 40 meq via ORAL
  Filled 2023-04-10 (×2): qty 2

## 2023-04-10 MED ORDER — SODIUM CHLORIDE 0.9 % WEIGHT BASED INFUSION
3.0000 mL/kg/h | INTRAVENOUS | Status: DC
  Administered 2023-04-11: 3 mL/kg/h via INTRAVENOUS

## 2023-04-10 MED ORDER — SODIUM CHLORIDE 0.9 % WEIGHT BASED INFUSION: 1.0000 mL/kg/h | INTRAVENOUS | Status: DC

## 2023-04-10 MED ORDER — TRAZODONE HCL 100 MG PO TABS
100.0000 mg | ORAL_TABLET | Freq: Every day | ORAL | Status: DC
  Administered 2023-04-10 – 2023-04-11 (×2): 100 mg via ORAL
  Filled 2023-04-10 (×2): qty 1

## 2023-04-10 NOTE — Plan of Care (Signed)
  Problem: Clinical Measurements: Goal: Will remain free from infection Outcome: Adequate for Discharge   

## 2023-04-10 NOTE — Progress Notes (Signed)
Rounding Note    Patient Name: Tina [name] Date of Encounter: 04/10/2023  [facility] Cardiologist: Jodelle Red, MD   Subjective   On my entrance, she stated that she is feeling better and wondering if she can go home. However, once I explained to her about her echo findings, she became tearful and wants to stay to get the valve fixed if possible. See extensive discussion below.  Inpatient Medications    Scheduled Meds:  furosemide  40 mg Intravenous BID   metoprolol tartrate  12.5 mg Oral BID   potassium chloride  40 mEq Oral Q4H   sodium chloride flush  3 mL Intravenous Q12H   Continuous Infusions:  sodium chloride     PRN Meds: sodium chloride, acetaminophen, iohexol, ondansetron (ZOFRAN) IV, sodium chloride flush   Vital Signs    Vitals:   04/10/23 0035 04/10/23 0504 04/10/23 0745 04/10/23 0819  BP: (!) 80/52 137/64 115/78   Pulse: (!) 50 63 79   Resp: 19 18 18 19   Temp: 97.7 F (36.5 C) 97.9 F (36.6 C) 97.9 F (36.6 C)   TempSrc: Oral Oral Oral   SpO2: 96% 95% 95%   Weight:  58.7 kg    Height:        Intake/Output Summary (Last 24 hours) at 04/10/2023 0937 Last data filed at 04/09/2023 2230 Gross per 24 hour  Intake --  Output 1550 ml  Net -1550 ml      04/10/2023    5:04 AM 04/09/2023    5:22 AM 04/09/2023    2:53 AM  Last 3 Weights  Weight (lbs) 129 lb 6.6 oz 133 lb 9.6 oz 133 lb 9.6 oz  Weight (kg) 58.7 kg 60.6 kg 60.6 kg      Telemetry    Sinus with intermittent atrial ectopic beats based on p wave morphology, rate controlled - Personally Reviewed  Physical Exam   GEN: No acute distress.   Neck: No JVD Cardiac: RRR, no murmurs, rubs, or gallops. Did not appreciate a diastolic rumble Respiratory: Clear to auscultation bilaterally in upper fields, mildly diminished at bilateral bases GI: Soft, nontender, non-distended  MS: No edema; No deformity. Neuro:  Nonfocal  Psych: Normal affect   New pertinent results  (labs, ECG, imaging, cardiac studies)    Echo personally reviewed: EF normal, moderate LV, RV function mildly reduced. Moderate to severe mitral stenosis with mean gradient 9 mmHg at 84 bpm. Severe MAC.  MVA 0.47 by continuity equation PHT 157, DT 543, MVA 1.4 by PHT/DT  Leaflets/subvalvular apparatus not well visualized but appear at least moderately thickened LA size underestimated on echo measurements  Patient Profile     72 y.o. female without significant prior past cardiac history presenting with acute hypoxic respiratory failure and acute diastolic heart failure, found to have severe mitral stenosis by echo.  Assessment & Plan    Acute hypoxic respiratory failure Acute diastolic heart failure Severe mitral stenosis -images personally reviewed, with manual calculations as above -Unable to calculate wilkins score based on limitations of imaging but available data suggest it will be >8 -admission weight 60.6 kg, current weight 58.7 kg. Charted net negative 2.5 L but intake not charted. Appears largely euvolemic today -discussed her echo findings, how this is managed -process will be for TEE to further define mitral valve/subvalvular apparatus. I suspect this will confirm that she is not a candidate for balloon valvuloplasty based on Wilkins score -then would need to proceed to [facility] and surgical  consultation for mitral valve replacement. She has diffuse coronary calcification on her CTPE. Discussed that if she has significant coronary disease, she may need CABG at the time of surgery.  Informed Consent   The risks [esophageal damage, perforation (1:10,000 risk), bleeding, pharyngeal hematoma as well as other potential complications associated with conscious sedation including aspiration, arrhythmia, respiratory failure and death], benefits (treatment guidance and diagnostic support) and alternatives of a transesophageal echocardiogram were discussed in detail with Ms. Fincher and she is  willing to proceed.   Informed Consent   Shared Decision Making/Informed Consent{ The risks [stroke (1 in 1000), death (1 in 1000), kidney failure [usually temporary] (1 in 500), bleeding (1 in 200), allergic reaction [possibly serious] (1 in 200)], benefits (diagnostic support and management of coronary artery disease) and alternatives of a cardiac catheterization were discussed in detail with Ms. Schwall and she is willing to proceed.  Paroxysmal atrial fibrillation vs. MAT -with mitral stenosis, this would be considered valvular atrial fibrillation, therefore warfarin is preferred over DOAC. However, I have reviewed her telemetry and ECG. I do not see any clear atrial fibrillation or flutter. Her ECG is consistent with MAT, and her telemetry is largely sinus with intermittent ectopic atrial beats -therefore, no indication for anticoagulation at this time. She is at elevated risk of afib with her mitral stenosis and LA dilation, so will need to continue to monitor for this -aim for rate control with severe mitral stenosis  Hypertension: was only on metoprolol as an outpatient. Using this for rate control, as above  Hypokalemia: being repleted  Prior tobacco use: quit December 2023  Time Spent with Patient: I have spent a total of 70 minutes in patient care, including reviewing hospital notes, telemetry, EKGs, labs; discussing with the team; examining the patient; and establishing an assessment and plan that was discussed with the patient.  > 50% of time was spent in direct patient care.      Signed, Jodelle Red, MD  04/10/2023, 9:37 AM

## 2023-04-10 NOTE — Plan of Care (Signed)

## 2023-04-10 NOTE — Progress Notes (Addendum)
PROGRESS NOTE    Tina [name]  VWU:[id number] DOB: 1950-12-21 DOA: 04/08/2023 PCP: System, Provider Not In   Brief Narrative: Tina [name] is a 72 y.o. female with a history of hypertension, hyperlipidemia.  Patient presented secondary to leg swelling and shortness of breath with concern for acute heart failure in addition to atrial fibrillation with RVR converted to sinus rhythm after Cardizem. Patient started on Lasix IV with some improvement of symptoms. Supplemental oxygen provided for associated hypoxia. Transthoracic Echocardiogram obtained and significant for mitral valve stenosis. Cardiology consulted.   Assessment and Plan:  Paroxymal atrial fibrillation with RVR Atrial fibrillation rhythm noted in the ED prior to admission with conversion to normal sinus rhythm with PACs vs MAT after Cardizem bolus and short infusion. No anticoagulation started on admission. Eliquis started by cardiology but is now discontinued -Cardiology recommendations  Acute heart failure Elevated BNP of 722.8 with pleural effusion and interstitial edema; associated dyspnea that improved with Lasix IV. Transthoracic Echocardiogram significant for a normal LVEF of 65-70% but was significant moderate-severe mitral stenosis. -Continue Lasix -Cardiology recommendations: diuresis, plan for [facility]  Mitral valve stenosis Moderate-severe on Transthoracic Echocardiogram. -Cardiology recommendations: Transesophageal Echocardiogram, plan for MV replacement  Pulmonary nodules -Located in left long. Recommendation for non-contrast chest CT in 3-6 months.  Acute respiratory failure with hypoxia Hypoxia with ambulation on room air with SpO2 of 85% per nursing and associated dyspnea. -Wean to room air as able -Ambulatory pulse ox prior to discharge  Right leg swelling Lower extremity venous duplex negative for DVT.   DVT prophylaxis: Lovenox Code Status:   Code Status: Full Code Family Communication:  None at bedside Disposition Plan: Discharge home with home health pending continued cardiology workup/management/recommendations   Consultants:  Cardiology  Procedures:  Transthoracic Echocardiogram  Antimicrobials: None    Subjective: Patient reports feeling better after diuresis. Leg swelling has improved.  Objective: BP 115/78 (BP Location: Right Arm)   Pulse 79   Temp 97.9 F (36.6 C) (Oral)   Resp 19   Ht 5\' 4"  (1.626 m)   Wt 58.7 kg   SpO2 95%   BMI 22.21 kg/m   Examination:  General exam: Appears calm and comfortable Respiratory system: Clear to auscultation. Respiratory effort normal. Cardiovascular system: S1 & S2 heard, Irregular rhythm with normal rate.  Gastrointestinal system: Abdomen is nondistended, soft and nontender. Normal bowel sounds heard. Central nervous system: Alert and oriented. Musculoskeletal: No edema. No calf tenderness Skin: No cyanosis. No rashes Psychiatry: Judgement and insight appear normal. Mood & affect appropriate.    Data Reviewed: I have personally reviewed following labs and imaging studies  CBC Lab Results  Component Value Date   WBC 6.8 04/08/2023   RBC 4.39 04/08/2023   HGB 12.7 04/08/2023   HCT 40.3 04/08/2023   MCV 91.8 04/08/2023   MCH 28.9 04/08/2023   PLT 313 04/08/2023   MCHC 31.5 04/08/2023   RDW 15.1 04/08/2023   LYMPHSABS 1.3 04/08/2023   MONOABS 0.5 04/08/2023   EOSABS 0.1 04/08/2023   BASOSABS 0.1 04/08/2023     Last metabolic panel Lab Results  Component Value Date   NA 135 04/10/2023   K 3.1 (L) 04/10/2023   CL 98 04/10/2023   CO2 26 04/10/2023   BUN 17 04/10/2023   CREATININE 1.11 (H) 04/10/2023   GLUCOSE 88 04/10/2023   GFRNONAA 52 (L) 04/10/2023   GFRAA >90 07/16/2014   CALCIUM 8.4 (L) 04/10/2023   PROT 7.9 04/08/2023   ALBUMIN 3.8  04/08/2023   BILITOT 0.6 04/08/2023   ALKPHOS 50 04/08/2023   AST 24 04/08/2023   ALT 13 04/08/2023   ANIONGAP 11 04/10/2023    GFR: Estimated  Creatinine Clearance: 39 mL/min (A) (by C-G formula based on SCr of 1.11 mg/dL (H)).  No results found for this or any previous visit (from the past 240 hour(s)).    Radiology Studies: ECHOCARDIOGRAM COMPLETE  Result Date: 04/09/2023    ECHOCARDIOGRAM REPORT   Patient Name:   Tina [name] Date of Exam: 04/09/2023 Medical Rec #:  [id number]          Height:       64.0 in Accession #:    [id number]         Weight:       133.6 lb Date of Birth:  15-Feb-1951           BSA:          1.648 m Patient Age:    72 years           BP:           140/84 mmHg Patient Gender: F                  HR:           88 bpm. Exam Location:  Inpatient Procedure: 2D Echo, Cardiac Doppler and Color Doppler Indications:    CHF-Acute Diastolic I50.31, Abnormal ECG W29.56  History:        Patient has no prior history of Echocardiogram examinations.                 CHF; Arrythmias:Tachycardia.  Sonographer:    Lucendia Herrlich RCS Referring Phys: [phone] JARED M GARDNER IMPRESSIONS  1. Left ventricular ejection fraction, by estimation, is 65 to 70%. The left ventricle has normal function. The left ventricle has no regional wall motion abnormalities. There is moderate left ventricular hypertrophy. Left ventricular diastolic parameters are indeterminate.  2. Right ventricular systolic function is mildly reduced. The right ventricular size is normal. There is mildly elevated pulmonary artery systolic pressure. The estimated right ventricular systolic pressure is 40.0 mmHg.  3. Left atrial size was moderately dilated.  4. The mitral valve is degenerative. Trivial mitral valve regurgitation. Moderate to severe mitral stenosis. The mean mitral valve gradient is 9.0 mmHg with average heart rate of 84 bpm. Severe mitral annular calcification.  5. The aortic valve is tricuspid. Aortic valve regurgitation is not visualized. Aortic valve sclerosis/calcification is present, without any evidence of aortic stenosis.  6. The inferior vena cava is dilated  in size with >50% respiratory variability, suggesting right atrial pressure of 8 mmHg. FINDINGS  Left Ventricle: Left ventricular ejection fraction, by estimation, is 65 to 70%. The left ventricle has normal function. The left ventricle has no regional wall motion abnormalities. The left ventricular internal cavity size was normal in size. There is  moderate left ventricular hypertrophy. Left ventricular diastolic parameters are indeterminate. Right Ventricle: The right ventricular size is normal. Right vetricular wall thickness was not well visualized. Right ventricular systolic function is mildly reduced. There is mildly elevated pulmonary artery systolic pressure. The tricuspid regurgitant velocity is 2.83 m/s, and with an assumed right atrial pressure of 8 mmHg, the estimated right ventricular systolic pressure is 40.0 mmHg. Left Atrium: Left atrial size was moderately dilated. Right Atrium: Right atrial size was normal in size. Pericardium: Trivial pericardial effusion is present. Mitral Valve: The mitral valve is degenerative in appearance.  Severe mitral annular calcification. Trivial mitral valve regurgitation. Moderate to severe mitral valve stenosis. MV peak gradient, 13.1 mmHg. The mean mitral valve gradient is 9.0 mmHg with average heart rate of 84 bpm. Tricuspid Valve: The tricuspid valve is normal in structure. Tricuspid valve regurgitation is trivial. Aortic Valve: The aortic valve is tricuspid. Aortic valve regurgitation is not visualized. Aortic valve sclerosis/calcification is present, without any evidence of aortic stenosis. Aortic valve mean gradient measures 3.0 mmHg. Aortic valve peak gradient measures 5.4 mmHg. Aortic valve area, by VTI measures 1.76 cm. Pulmonic Valve: The pulmonic valve was not well visualized. Pulmonic valve regurgitation is trivial. Aorta: The aortic root and ascending aorta are structurally normal, with no evidence of dilitation. Venous: The inferior vena cava is dilated  in size with greater than 50% respiratory variability, suggesting right atrial pressure of 8 mmHg. IAS/Shunts: The interatrial septum was not well visualized.  LEFT VENTRICLE PLAX 2D LVIDd:         4.90 cm   Diastology LVIDs:         3.50 cm   LV e' medial:    5.35 cm/s LV PW:         1.50 cm   LV E/e' medial:  31.4 LV IVS:        1.30 cm   LV e' lateral:   5.51 cm/s LVOT diam:     1.80 cm   LV E/e' lateral: 30.5 LV SV:         38 LV SV Index:   23 LVOT Area:     2.54 cm  RIGHT VENTRICLE            IVC RV S prime:     8.51 cm/s  IVC diam: 2.20 cm TAPSE (M-mode): 1.6 cm LEFT ATRIUM             Index        RIGHT ATRIUM           Index LA diam:        4.90 cm 2.97 cm/m   RA Area:     12.10 cm LA Vol (A2C):   65.3 ml 39.62 ml/m  RA Volume:   21.90 ml  13.29 ml/m LA Vol (A4C):   61.0 ml 37.01 ml/m LA Biplane Vol: 67.9 ml 41.20 ml/m  AORTIC VALVE AV Area (Vmax):    2.00 cm AV Area (Vmean):   1.83 cm AV Area (VTI):     1.76 cm AV Vmax:           116.00 cm/s AV Vmean:          76.600 cm/s AV VTI:            0.215 m AV Peak Grad:      5.4 mmHg AV Mean Grad:      3.0 mmHg LVOT Vmax:         91.07 cm/s LVOT Vmean:        55.133 cm/s LVOT VTI:          0.149 m LVOT/AV VTI ratio: 0.69  AORTA Ao Root diam: 2.70 cm Ao Asc diam:  2.90 cm MITRAL VALVE                TRICUSPID VALVE MV Area (PHT): 3.77 cm     TR Peak grad:   32.0 mmHg MV Area VTI:   0.56 cm     TR Vmax:        283.00 cm/s MV Peak grad:  13.1 mmHg MV Mean grad:  9.0 mmHg     SHUNTS MV Vmax:       1.81 m/s     Systemic VTI:  0.15 m MV Vmean:      133.5 cm/s   Systemic Diam: 1.80 cm MV Decel Time: 201 msec MR Peak grad: 48.7 mmHg MR Vmax:      349.00 cm/s MV E velocity: 168.00 cm/s MV A velocity: 199.00 cm/s MV E/A ratio:  0.84 Epifanio Lesches MD Electronically signed by Epifanio Lesches MD Signature Date/Time: 04/09/2023/3:09:16 PM    Final    CT Angio Chest PE W and/or Wo Contrast  Result Date: 04/08/2023 CLINICAL DATA:  Shortness of breath,  labored breathing with exertion. EXAM: CT ANGIOGRAPHY CHEST WITH CONTRAST TECHNIQUE: Multidetector CT imaging of the chest was performed using the standard protocol during bolus administration of intravenous contrast. Multiplanar CT image reconstructions and MIPs were obtained to evaluate the vascular anatomy. RADIATION DOSE REDUCTION: This exam was performed according to the departmental dose-optimization program which includes automated exposure control, adjustment of the mA and/or kV according to patient size and/or use of iterative reconstruction technique. CONTRAST:  75mL OMNIPAQUE IOHEXOL 350 MG/ML SOLN COMPARISON:  Chest x-ray today. FINDINGS: Cardiovascular: No filling defects in the pulmonary arteries to suggest pulmonary emboli. Cardiomegaly. Diffusely calcified coronary arteries. Moderate aortic calcifications. No evidence of aortic aneurysm. Small pericardial effusion. Densely calcified mitral valve annulus. Mediastinum/Nodes: Precarinal lymph node has a short axis diameter of 14 mm. Right hilar lymph node has a short axis diameter of 15 mm. No axillary or left hilar adenopathy. Trachea and esophagus are unremarkable. Thyroid unremarkable. Lungs/Pleura: Small bilateral pleural effusions. Dependent bibasilar atelectasis. Mild centrilobular emphysema. Nodular density in the left upper lobe measures 1.8 x 1.1 cm. Favor scarring, but this warrants follow-up. Nodular density in the lingula measuring up to 10 mm. Interstitial prominence peripherally, favor scarring/early fibrosis. Upper Abdomen: No acute abnormality Musculoskeletal: Chest wall soft tissues are unremarkable. No acute bony abnormality. Review of the MIP images confirms the above findings. IMPRESSION: No evidence of pulmonary embolus. Cardiomegaly.  Diffuse coronary artery disease. Small bilateral pleural effusions, bibasilar and dependent atelectasis. Nodular opacities in the left lung. Non-contrast chest CT at 3-6 months is recommended. If the  nodules are stable at time of repeat CT, then future CT at 18-24 months (from today's scan) is considered optional for low-risk patients, but is recommended for high-risk patients. This recommendation follows the consensus statement: Guidelines for Management of Incidental Pulmonary Nodules Detected on CT Images: From the Fleischner Society 2017; Radiology 2017; 284:228-243. Aortic Atherosclerosis (ICD10-I70.0) and Emphysema (ICD10-J43.9). Electronically Signed   By: Charlett Nose M.D.   On: 04/08/2023 22:24   DG Foot Complete Right  Result Date: 04/08/2023 CLINICAL DATA:  Right leg pain.  Ankle swelling.  No known injury. EXAM: RIGHT FOOT COMPLETE - 3+ VIEW COMPARISON:  None Available. 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 Old fracture fragment off the tip of the lateral malleolus which is well corticated. No acute fracture, subluxation or dislocation. Soft tissues are intact. IMPRESSION: No acute bony abnormality. Electronically Signed   By: Charlett Nose M.D.   On: 04/08/2023 21:49   DG Ankle Complete Right  Result Date: 04/08/2023 CLINICAL DATA:  Right ankle swelling, pain.  No known injury. EXAM: RIGHT ANKLE - COMPLETE 3+ VIEW COMPARISON:  None Available. FINDINGS: Old lateral malleolar fracture. No acute fracture, subluxation or dislocation. Mild spurring within the right ankle joint. Diffuse soft tissue swelling.  IMPRESSION: No acute bony abnormality. Electronically Signed   By: Charlett Nose M.D.   On: 04/08/2023 21:48   DG Chest Port 1 View  Result Date: 04/08/2023 CLINICAL DATA:  Shortness of breath EXAM: PORTABLE CHEST 1 VIEW COMPARISON:  None Available. FINDINGS: Cardiomegaly. Diffuse interstitial prominence throughout the lungs may reflect interstitial edema. Small bilateral pleural effusions. No acute bony abnormality. IMPRESSION: Cardiomegaly with diffuse interstitial prominence, likely interstitial edema. Small bilateral pleural effusions. Electronically Signed   By: Charlett Nose M.D.   On: 04/08/2023 21:48   US Venous Img Lower Unilateral Right  Result Date: 04/08/2023 CLINICAL DATA:  Right leg swelling EXAM: RIGHT LOWER EXTREMITY VENOUS DOPPLER ULTRASOUND TECHNIQUE: Gray-scale sonography with graded compression, as well as color Doppler and duplex ultrasound were performed to evaluate the lower extremity deep venous systems from the level of the common femoral vein and including the common femoral, femoral, profunda femoral, popliteal and calf veins including the posterior tibial, peroneal and gastrocnemius veins when visible. The superficial great saphenous vein was also interrogated. Spectral Doppler was utilized to evaluate flow at rest and with distal augmentation maneuvers in the common femoral, femoral and popliteal veins. COMPARISON:  None Available. FINDINGS: Contralateral Common Femoral Vein: Respiratory phasicity is normal and symmetric with the symptomatic side. No evidence of thrombus. Normal compressibility. Common Femoral Vein: No evidence of thrombus. Normal compressibility, respiratory phasicity and response to augmentation. Saphenofemoral Junction: No evidence of thrombus. Normal compressibility and flow on color Doppler imaging. Profunda Femoral Vein: No evidence of thrombus. Normal compressibility and flow on color Doppler imaging. Femoral Vein: Not well visualized distally due to overlying edema. Popliteal Vein: No evidence of thrombus. Normal compressibility, respiratory phasicity and response to augmentation. Calf Veins: Limited visualization due to overlying edema. Superficial Great Saphenous Vein: No evidence of thrombus. Normal compressibility. Venous Reflux:  None. Other Findings:  None. IMPRESSION: Somewhat limited exam to overlying edema and patient discomfort. No definitive thrombus is noted. Electronically Signed   By: Alcide Clever M.D.   On: 04/08/2023 21:22      LOS: 0 days    Jacquelin Hawking, MD [facility] 04/10/2023, 2:21 PM   If  7PM-7AM, please contact night-coverage [email or web address]

## 2023-04-10 NOTE — Evaluation (Signed)
Physical Therapy Evaluation Patient Details Name: Tina [name] MRN: [id number] DOB: 05-04-1951 Today's Date: 04/10/2023  History of Present Illness  72 yo female presents to [facility] on 9/28 with R ankle swelling, DOE. Pt found to have ectopic atrial tachycardia, new dx CHF. PMH includes HTN, HLD, anxiety, depression, OA, tobacco use, BL THA.  Clinical Impression   Pt presents with generalized weakness, impaired balance with no reported history of falls prior to admission, impaired activity tolerance. Pt to benefit from acute PT to address deficits. Pt ambulated short hallway distance with use of rollator and close guard for safety, cues for form/safety throughout. SPO2 88% and greater on RA, HR 120s with activity. PT to progress mobility as tolerated, and will continue to follow acutely.          If plan is discharge home, recommend the following: A little help with walking and/or transfers;A little help with bathing/dressing/bathroom   Can travel by private vehicle        Equipment Recommendations None recommended by PT  Recommendations for Other Services       Functional Status Assessment Patient has had a recent decline in their functional status and demonstrates the ability to make significant improvements in function in a reasonable and predictable amount of time.     Precautions / Restrictions Precautions Precautions: Fall;Other (comment) Precaution Comments: monitor O2 Restrictions Weight Bearing Restrictions: No      Mobility  Bed Mobility Overal bed mobility: Needs Assistance Bed Mobility: Supine to Sit, Sit to Supine     Supine to sit: Supervision Sit to supine: Supervision        Transfers Overall transfer level: Needs assistance Equipment used: Rollator (4 wheels) Transfers: Sit to/from Stand Sit to Stand: Contact guard assist           General transfer comment: for safety, slow to rise and sit    Ambulation/Gait Ambulation/Gait assistance:  Contact guard assist Gait Distance (Feet): 90 Feet Assistive device: Rollator (4 wheels) Gait Pattern/deviations: Step-through pattern, Decreased stride length, Trunk flexed Gait velocity: decr     General Gait Details: close guard for safety, cues for upright posture and rollator use. SPO2 88% and greater on RA, HR 120s with acitivty  Stairs            Wheelchair Mobility     Tilt Bed    Modified Rankin (Stroke Patients Only)       Balance Overall balance assessment: Needs assistance Sitting-balance support: No upper extremity supported, Feet supported Sitting balance-Leahy Scale: Good     Standing balance support: Bilateral upper extremity supported, During functional activity Standing balance-Leahy Scale: Fair                               Pertinent Vitals/Pain Pain Assessment Pain Assessment: No/denies pain    Home Living Family/patient expects to be discharged to:: Private residence Living Arrangements: Children;Other (Comment) (son and daughter in law) Available Help at Discharge: Family;Available 24 hours/day (daughter in law works from home) Type of Home: House Home Access: Stairs to enter Entrance Stairs-Rails: Right Entrance Stairs-Number of Steps: 4   Home Layout: One level Home Equipment: Rollator (4 wheels);Shower seat      Prior Function Prior Level of Function : Independent/Modified Independent             Mobility Comments: Rollator for mobility ADLs Comments: MOD I for ADLs     Extremity/Trunk Assessment   Upper  Extremity Assessment Upper Extremity Assessment: Defer to OT evaluation    Lower Extremity Assessment Lower Extremity Assessment: Generalized weakness    Cervical / Trunk Assessment Cervical / Trunk Assessment: Normal  Communication   Communication Communication: No apparent difficulties Cueing Techniques: Verbal cues;Gestural cues  Cognition Arousal: Alert Behavior During Therapy: WFL for tasks  assessed/performed Overall Cognitive Status: Within Functional Limits for tasks assessed                                 General Comments: unsure of pt baseline, does have moments of deficits in safety awareness with rollator. can be tangential in conversation        General Comments      Exercises     Assessment/Plan    PT Assessment Patient needs continued PT services  PT Problem List Decreased strength;Decreased mobility;Decreased activity tolerance;Decreased balance;Cardiopulmonary status limiting activity;Decreased safety awareness;Decreased knowledge of precautions       PT Treatment Interventions DME instruction;Therapeutic activities;Therapeutic exercise;Gait training;Patient/family education;Balance training;Stair training;Neuromuscular re-education;Functional mobility training    PT Goals (Current goals can be found in the Care Plan section)  Acute Rehab PT Goals Patient Stated Goal: home PT Goal Formulation: With patient Time For Goal Achievement: 04/24/23 Potential to Achieve Goals: Good    Frequency Min 1X/week     Co-evaluation               AM-PAC PT "6 Clicks" Mobility  Outcome Measure Help needed turning from your back to your side while in a flat bed without using bedrails?: A Little Help needed moving from lying on your back to sitting on the side of a flat bed without using bedrails?: A Little Help needed moving to and from a bed to a chair (including a wheelchair)?: A Little Help needed standing up from a chair using your arms (e.g., wheelchair or bedside chair)?: A Little Help needed to walk in hospital room?: A Little Help needed climbing 3-5 steps with a railing? : A Lot 6 Click Score: 17    End of Session   Activity Tolerance: Patient tolerated treatment well;Patient limited by fatigue Patient left: in bed;with call bell/phone within reach;with bed alarm set Nurse Communication: Mobility status PT Visit Diagnosis: Other  abnormalities of gait and mobility (R26.89);Muscle weakness (generalized) (M62.81)    Time: 1031-1050 PT Time Calculation (min) (ACUTE ONLY): 19 min   Charges:   PT Evaluation $PT Eval Low Complexity: 1 Low   PT General Charges $$ ACUTE PT VISIT: 1 Visit         Marye Round, PT DPT Acute Rehabilitation Services Secure Chat Preferred  Office [phone]   Roldan Laforest Sheliah Plane 04/10/2023, 12:38 PM

## 2023-04-10 NOTE — Care Management Obs Status (Signed)
MEDICARE OBSERVATION STATUS NOTIFICATION   Patient Details  Name: Tina [name] MRN: [id number] Date of Birth: 1951/02/21   Medicare Observation Status Notification Given:  Yes    Ronny Bacon, RN 04/10/2023, 7:54 AM

## 2023-04-10 NOTE — Evaluation (Signed)
Occupational Therapy Evaluation Patient Details Name: Tina [name] MRN: [id number] DOB: February 24, 1951 Today's Date: 04/10/2023   History of Present Illness 72 yo female presents to [facility] on 9/28 with R ankle swelling, DOE. Pt found to have ectopic atrial tachycardia, new dx CHF. PMH includes HTN, HLD, anxiety, depression, OA, tobacco use, BL THA.   Clinical Impression   PTA, pt lives with son and daughter in law, typically Modified Independent with ADLs and mobility using Rollator. Pt presents now with deficits in cardiopulmonary tolerance, strength and dynamic standing balance. Overall, pt requires CGA for mobility using RW and CGA for LB ADLs. Encouraged daily weights at DC to monitor for potential fluid overload. SpO2 87-88% on RA at rest and with activity. Anticipate no OT needs at DC.       If plan is discharge home, recommend the following: Assistance with cooking/housework;Help with stairs or ramp for entrance    Functional Status Assessment  Patient has had a recent decline in their functional status and demonstrates the ability to make significant improvements in function in a reasonable and predictable amount of time.  Equipment Recommendations  None recommended by OT    Recommendations for Other Services       Precautions / Restrictions Precautions Precautions: Fall;Other (comment) Precaution Comments: monitor O2 Restrictions Weight Bearing Restrictions: No      Mobility Bed Mobility Overal bed mobility: Needs Assistance Bed Mobility: Supine to Sit, Sit to Supine     Supine to sit: Supervision Sit to supine: Supervision        Transfers Overall transfer level: Needs assistance Equipment used: Rolling walker (2 wheels) Transfers: Sit to/from Stand Sit to Stand: Contact guard assist                  Balance Overall balance assessment: Needs assistance Sitting-balance support: No upper extremity supported, Feet supported Sitting balance-Leahy Scale:  Good     Standing balance support: Bilateral upper extremity supported, During functional activity Standing balance-Leahy Scale: Fair                             ADL either performed or assessed with clinical judgement   ADL Overall ADL's : Needs assistance/impaired Eating/Feeding: Independent   Grooming: Contact guard assist;Standing   Upper Body Bathing: Sitting;Set up   Lower Body Bathing: Contact guard assist;Sitting/lateral leans;Sit to/from stand   Upper Body Dressing : Set up;Sitting   Lower Body Dressing: Contact guard assist;Sit to/from stand;Sitting/lateral leans Lower Body Dressing Details (indicate cue type and reason): able to bend to feet to adjust socks fairly easily Toilet Transfer: Contact guard assist;Ambulation;Rolling walker (2 wheels)   Toileting- Clothing Manipulation and Hygiene: Contact guard assist;Sitting/lateral lean;Sit to/from stand       Functional mobility during ADLs: Contact guard assist;Rolling walker (2 wheels) General ADL Comments: Educated re: daily weights to prevent CHF exacerbation     Vision Baseline Vision/History: 0 No visual deficits Ability to See in Adequate Light: 0 Adequate Patient Visual Report: No change from baseline Vision Assessment?: No apparent visual deficits     Perception         Praxis         Pertinent Vitals/Pain Pain Assessment Pain Assessment: No/denies pain     Extremity/Trunk Assessment Upper Extremity Assessment Upper Extremity Assessment: Generalized weakness;Right hand dominant   Lower Extremity Assessment Lower Extremity Assessment: Defer to PT evaluation   Cervical / Trunk Assessment Cervical / Trunk Assessment: Normal  Communication Communication Communication: No apparent difficulties   Cognition Arousal: Alert Behavior During Therapy: WFL for tasks assessed/performed Overall Cognitive Status: Within Functional Limits for tasks assessed                                        General Comments  O2 not in nose on entry. SpO2 87-88% on RA at rest and with activity- RN notified    Exercises     Shoulder Instructions      Home Living Family/patient expects to be discharged to:: Private residence Living Arrangements: Children;Other (Comment) (son and daughter in law) Available Help at Discharge: Family;Available 24 hours/day (daughter in law works from home) Type of Home: House Home Access: Stairs to enter Secretary/administrator of Steps: 4 Entrance Stairs-Rails: Right Home Layout: One level     Bathroom Shower/Tub: Producer, television/film/video: Standard     Home Equipment: Rollator (4 wheels);Shower seat          Prior Functioning/Environment Prior Level of Function : Independent/Modified Independent             Mobility Comments: Rollator for mobility ADLs Comments: MOD I for ADLs        OT Problem List: Decreased strength;Decreased activity tolerance;Impaired balance (sitting and/or standing);Cardiopulmonary status limiting activity;Increased edema      OT Treatment/Interventions: Self-care/ADL training;Therapeutic exercise;Energy conservation;DME and/or AE instruction;Therapeutic activities;Patient/family education;Balance training    OT Goals(Current goals can be found in the care plan section) Acute Rehab OT Goals Patient Stated Goal: to get rid of O2, minimize swelling in legs OT Goal Formulation: With patient Time For Goal Achievement: 04/24/23 Potential to Achieve Goals: Good ADL Goals Pt Will Perform Lower Body Dressing: with modified independence;sit to/from stand Pt Will Transfer to Toilet: with modified independence;ambulating Additional ADL Goal #1: Pt to verbalize at least 3 strategies to prevent CHF exacerbation Additional ADL Goal #2: Pt to increase standing tolerance > 8 min during ADLs/mobility  OT Frequency: Min 1X/week    Co-evaluation              AM-PAC OT "6 Clicks" Daily  Activity     Outcome Measure Help from another person eating meals?: None Help from another person taking care of personal grooming?: A Little Help from another person toileting, which includes using toliet, bedpan, or urinal?: A Little Help from another person bathing (including washing, rinsing, drying)?: A Little Help from another person to put on and taking off regular upper body clothing?: A Little Help from another person to put on and taking off regular lower body clothing?: A Little 6 Click Score: 19   End of Session Equipment Utilized During Treatment: Rolling walker (2 wheels) Nurse Communication: Mobility status  Activity Tolerance: Patient tolerated treatment well Patient left: in bed;with call bell/phone within reach  OT Visit Diagnosis: Unsteadiness on feet (R26.81);Other abnormalities of gait and mobility (R26.89);Muscle weakness (generalized) (M62.81)                Time: 1610-9604 OT Time Calculation (min): 17 min Charges:  OT General Charges $OT Visit: 1 Visit OT Evaluation $OT Eval Low Complexity: 1 Low  Tina [name], OTR/L Acute Rehab Services Office: [phone]   Lorre Munroe 04/10/2023, 9:38 AM

## 2023-04-11 ENCOUNTER — Inpatient Hospital Stay (HOSPITAL_COMMUNITY): Payer: Medicare HMO | Admitting: Anesthesiology

## 2023-04-11 ENCOUNTER — Encounter (HOSPITAL_COMMUNITY)
Admission: EM | Disposition: A | Discharge status: Home Health | Payer: Self-pay | Source: Home / Self Care | Attending: Family Medicine

## 2023-04-11 ENCOUNTER — Encounter (HOSPITAL_COMMUNITY): Payer: Self-pay | Admitting: Internal Medicine

## 2023-04-11 ENCOUNTER — Inpatient Hospital Stay (HOSPITAL_COMMUNITY): Payer: Medicare HMO

## 2023-04-11 DIAGNOSIS — I4891 Unspecified atrial fibrillation: Secondary | ICD-10-CM | POA: Diagnosis not present

## 2023-04-11 DIAGNOSIS — I340 Nonrheumatic mitral (valve) insufficiency: Secondary | ICD-10-CM

## 2023-04-11 DIAGNOSIS — I342 Nonrheumatic mitral (valve) stenosis: Secondary | ICD-10-CM | POA: Diagnosis not present

## 2023-04-11 DIAGNOSIS — I509 Heart failure, unspecified: Secondary | ICD-10-CM | POA: Diagnosis not present

## 2023-04-11 DIAGNOSIS — I4719 Other supraventricular tachycardia: Secondary | ICD-10-CM | POA: Diagnosis not present

## 2023-04-11 DIAGNOSIS — I2510 Atherosclerotic heart disease of native coronary artery without angina pectoris: Secondary | ICD-10-CM | POA: Diagnosis not present

## 2023-04-11 DIAGNOSIS — I38 Endocarditis, valve unspecified: Secondary | ICD-10-CM | POA: Diagnosis not present

## 2023-04-11 DIAGNOSIS — I5031 Acute diastolic (congestive) heart failure: Secondary | ICD-10-CM | POA: Diagnosis not present

## 2023-04-11 DIAGNOSIS — I050 Rheumatic mitral stenosis: Secondary | ICD-10-CM | POA: Diagnosis not present

## 2023-04-11 HISTORY — PX: RIGHT/LEFT HEART CATH AND CORONARY ANGIOGRAPHY: CATH118266

## 2023-04-11 HISTORY — PX: TEE WITHOUT CARDIOVERSION: SHX5443

## 2023-04-11 LAB — POCT I-STAT EG7
Acid-Base Excess: 2 mmol/L (ref 0.0–2.0)
Acid-Base Excess: 3 mmol/L — ABNORMAL HIGH (ref 0.0–2.0)
Bicarbonate: 26.6 mmol/L (ref 20.0–28.0)
Bicarbonate: 27.3 mmol/L (ref 20.0–28.0)
Calcium, Ion: 1.12 mmol/L — ABNORMAL LOW (ref 1.15–1.40)
Calcium, Ion: 1.12 mmol/L — ABNORMAL LOW (ref 1.15–1.40)
HCT: 38 % (ref 36.0–46.0)
HCT: 38 % (ref 36.0–46.0)
Hemoglobin: 12.9 g/dL (ref 12.0–15.0)
Hemoglobin: 12.9 g/dL (ref 12.0–15.0)
O2 Saturation: 57 %
O2 Saturation: 60 %
Potassium: 3.6 mmol/L (ref 3.5–5.1)
Potassium: 3.7 mmol/L (ref 3.5–5.1)
Sodium: 138 mmol/L (ref 135–145)
Sodium: 138 mmol/L (ref 135–145)
TCO2: 28 mmol/L (ref 22–32)
TCO2: 29 mmol/L (ref 22–32)
pCO2, Ven: 41.4 mm[Hg] — ABNORMAL LOW (ref 44–60)
pCO2, Ven: 42.1 mm[Hg] — ABNORMAL LOW (ref 44–60)
pH, Ven: 7.416 (ref 7.25–7.43)
pH, Ven: 7.42 (ref 7.25–7.43)
pO2, Ven: 29 mm[Hg] — CL (ref 32–45)
pO2, Ven: 31 mm[Hg] — CL (ref 32–45)

## 2023-04-11 LAB — BASIC METABOLIC PANEL
Anion gap: 12 (ref 5–15)
BUN: 20 mg/dL (ref 8–23)
CO2: 24 mmol/L (ref 22–32)
Calcium: 8.5 mg/dL — ABNORMAL LOW (ref 8.9–10.3)
Chloride: 99 mmol/L (ref 98–111)
Creatinine, Ser: 1.09 mg/dL — ABNORMAL HIGH (ref 0.44–1.00)
GFR, Estimated: 54 mL/min — ABNORMAL LOW (ref >60)
Glucose, Bld: 99 mg/dL (ref 70–99)
Potassium: 3.6 mmol/L (ref 3.5–5.1)
Sodium: 135 mmol/L (ref 135–145)

## 2023-04-11 LAB — POCT I-STAT 7, (LYTES, BLD GAS, ICA,H+H)
Acid-Base Excess: 1 mmol/L (ref 0.0–2.0)
Bicarbonate: 25.3 mmol/L (ref 20.0–28.0)
Calcium, Ion: 1.12 mmol/L — ABNORMAL LOW (ref 1.15–1.40)
HCT: 37 % (ref 36.0–46.0)
Hemoglobin: 12.6 g/dL (ref 12.0–15.0)
O2 Saturation: 90 %
Potassium: 3.6 mmol/L (ref 3.5–5.1)
Sodium: 136 mmol/L (ref 135–145)
TCO2: 26 mmol/L (ref 22–32)
pCO2 arterial: 36.9 mm[Hg] (ref 32–48)
pH, Arterial: 7.444 (ref 7.35–7.45)
pO2, Arterial: 56 mm[Hg] — ABNORMAL LOW (ref 83–108)

## 2023-04-11 LAB — CBC
HCT: 42.8 % (ref 36.0–46.0)
Hemoglobin: 13.3 g/dL (ref 12.0–15.0)
MCH: 29.2 pg (ref 26.0–34.0)
MCHC: 31.1 g/dL (ref 30.0–36.0)
MCV: 94.1 fL (ref 80.0–100.0)
Platelets: 180 10*3/uL (ref 150–400)
RBC: 4.55 MIL/uL (ref 3.87–5.11)
RDW: 14.8 % (ref 11.5–15.5)
WBC: 6.7 10*3/uL (ref 4.0–10.5)
nRBC: 0 % (ref 0.0–0.2)

## 2023-04-11 LAB — CREATININE, SERUM
Creatinine, Ser: 0.96 mg/dL (ref 0.44–1.00)
GFR, Estimated: >60 mL/min (ref >60)

## 2023-04-11 LAB — ECHO TEE: Area-P 1/2: 1.48 cm2

## 2023-04-11 SURGERY — RIGHT/LEFT HEART CATH AND CORONARY ANGIOGRAPHY
Anesthesia: LOCAL

## 2023-04-11 SURGERY — ECHOCARDIOGRAM, TRANSESOPHAGEAL
Anesthesia: Monitor Anesthesia Care

## 2023-04-11 MED ORDER — IOHEXOL 350 MG/ML SOLN
INTRAVENOUS | Status: DC | PRN
  Administered 2023-04-11: 28 mL

## 2023-04-11 MED ORDER — PROPOFOL 500 MG/50ML IV EMUL
INTRAVENOUS | Status: DC | PRN
  Administered 2023-04-11: 50 mg via INTRAVENOUS
  Administered 2023-04-11: 75 ug/kg/min via INTRAVENOUS

## 2023-04-11 MED ORDER — VERAPAMIL HCL 2.5 MG/ML IV SOLN
INTRAVENOUS | Status: AC
  Filled 2023-04-11: qty 2

## 2023-04-11 MED ORDER — HEPARIN SODIUM (PORCINE) 5000 UNIT/ML IJ SOLN
5000.0000 [IU] | Freq: Three times a day (TID) | INTRAMUSCULAR | Status: DC
  Administered 2023-04-11 – 2023-04-12 (×2): 5000 [IU] via SUBCUTANEOUS
  Filled 2023-04-11 (×2): qty 1

## 2023-04-11 MED ORDER — LIDOCAINE HCL (PF) 1 % IJ SOLN
INTRAMUSCULAR | Status: DC | PRN
  Administered 2023-04-11: 12 mL

## 2023-04-11 MED ORDER — SODIUM CHLORIDE 0.9% FLUSH
3.0000 mL | Freq: Two times a day (BID) | INTRAVENOUS | Status: DC
  Administered 2023-04-11 – 2023-04-12 (×2): 3 mL via INTRAVENOUS

## 2023-04-11 MED ORDER — FUROSEMIDE 40 MG PO TABS
40.0000 mg | ORAL_TABLET | Freq: Every day | ORAL | Status: DC
  Administered 2023-04-11 – 2023-04-12 (×2): 40 mg via ORAL
  Filled 2023-04-11 (×2): qty 1

## 2023-04-11 MED ORDER — HEPARIN (PORCINE) IN NACL 1000-0.9 UT/500ML-% IV SOLN
INTRAVENOUS | Status: DC | PRN
  Administered 2023-04-11 (×2): 500 mL

## 2023-04-11 MED ORDER — LIDOCAINE HCL (PF) 1 % IJ SOLN
INTRAMUSCULAR | Status: AC
  Filled 2023-04-11: qty 30

## 2023-04-11 MED ORDER — HEPARIN SODIUM (PORCINE) 1000 UNIT/ML IJ SOLN
INTRAMUSCULAR | Status: AC
  Filled 2023-04-11: qty 10

## 2023-04-11 MED ORDER — SODIUM CHLORIDE 0.9% FLUSH: 3.0000 mL | INTRAVENOUS | Status: DC | PRN

## 2023-04-11 MED ORDER — SODIUM CHLORIDE 0.9 % IV SOLN: 250.0000 mL | INTRAVENOUS | Status: DC | PRN

## 2023-04-11 SURGICAL SUPPLY — 14 items
CATH INFINITI 5FR MULTPACK ANG (CATHETERS) IMPLANT
CATH SWAN GANZ 7F STRAIGHT (CATHETERS) IMPLANT
CLOSURE MYNX CONTROL 5F (Vascular Products) IMPLANT
GLIDESHEATH SLEND SS 6F .021 (SHEATH) IMPLANT
GUIDEWIRE INQWIRE 1.5J.035X260 (WIRE) IMPLANT
INQWIRE 1.5J .035X260CM (WIRE) ×1
KIT MICROPUNCTURE NIT STIFF (SHEATH) IMPLANT
PACK CARDIAC CATHETERIZATION (CUSTOM PROCEDURE TRAY) ×1 IMPLANT
PROTECTION STATION PRESSURIZED (MISCELLANEOUS) ×1
SET ATX-X65L (MISCELLANEOUS) IMPLANT
SHEATH PINNACLE 5F 10CM (SHEATH) IMPLANT
SHEATH PINNACLE 7F 10CM (SHEATH) IMPLANT
SHEATH PROBE COVER 6X72 (BAG) IMPLANT
STATION PROTECTION PRESSURIZED (MISCELLANEOUS) IMPLANT

## 2023-04-11 NOTE — Interval H&P Note (Signed)
History and Physical Interval Note:  04/11/2023 10:50 AM  Tina [name]  has presented today for surgery, with the diagnosis of severe mitral stenosis.  The various methods of treatment have been discussed with the patient and family. After consideration of risks, benefits and other options for treatment, the patient has consented to  Procedure(s): TRANSESOPHAGEAL ECHOCARDIOGRAM (N/A) as a surgical intervention.  The patient's history has been reviewed, patient examined, no change in status, stable for surgery.  I have reviewed the patient's chart and labs.  Questions were answered to the patient's satisfaction.     [name]

## 2023-04-11 NOTE — Interval H&P Note (Signed)
History and Physical Interval Note:  04/11/2023 11:07 AM  Tina [name]  has presented today for surgery, with the diagnosis of nstemi.  The various methods of treatment have been discussed with the patient and family. After consideration of risks, benefits and other options for treatment, the patient has consented to  Procedure(s): RIGHT/LEFT HEART CATH AND CORONARY ANGIOGRAPHY (N/A) as a surgical intervention.  The patient's history has been reviewed, patient examined, no change in status, stable for surgery.  I have reviewed the patient's chart and labs.  Questions were answered to the patient's satisfaction.     Theron Arista [facility] 04/11/2023 11:07 AM

## 2023-04-11 NOTE — H&P (View-Only) (Signed)
Rounding Note    Patient Name: Tina [name] Date of Encounter: 04/11/2023  [facility] Cardiologist: Jodelle Red, MD   Subjective   Seen prior to her TEE this AM. Reviewed the plan for testing today, she is amenable. Her O2 sats have been around 90 on room air, she says that she keeps getting told to take deep breaths. No pain.  Inpatient Medications    Scheduled Meds:  [MAR Hold] furosemide  40 mg Intravenous BID   [MAR Hold] metoprolol tartrate  12.5 mg Oral BID   [MAR Hold] sodium chloride flush  3 mL Intravenous Q12H   [MAR Hold] traZODone  100 mg Oral QHS   Continuous Infusions:  [MAR Hold] sodium chloride     sodium chloride 1 mL/kg/hr (04/11/23 0518)   PRN Meds: [MAR Hold] sodium chloride, [MAR Hold] acetaminophen, [MAR Hold] iohexol, [MAR Hold] ondansetron (ZOFRAN) IV, [MAR Hold] sodium chloride flush   Vital Signs    Vitals:   04/11/23 0032 04/11/23 0605 04/11/23 0800 04/11/23 0824  BP: 132/68 113/77  119/65  Pulse:  77  78  Resp: 19 19 18 13   Temp: (!) 97.4 F (36.3 C) 98.4 F (36.9 C)  98 F (36.7 C)  TempSrc: Oral Oral  Temporal  SpO2: 95% 94%  96%  Weight:  58.1 kg    Height:        Intake/Output Summary (Last 24 hours) at 04/11/2023 1021 Last data filed at 04/11/2023 0518 Gross per 24 hour  Intake 240 ml  Output 200 ml  Net 40 ml      04/11/2023    6:05 AM 04/10/2023    5:04 AM 04/09/2023    5:22 AM  Last 3 Weights  Weight (lbs) 128 lb 1.4 oz 129 lb 6.6 oz 133 lb 9.6 oz  Weight (kg) 58.1 kg 58.7 kg 60.6 kg      Telemetry    Sinus with intermittent atrial ectopic beats based on p wave morphology, rate controlled - Personally Reviewed  Physical Exam   GEN: Well nourished, well developed in no acute distress NECK: No JVD CARDIAC: regular rhythm, normal S1 and S2, no rubs or gallops. No murmur appreciated. VASCULAR: Radial pulses 2+ bilaterally.  RESPIRATORY:  Clear to auscultation without wheezing or rhonchi in  upper fields, mildly diminished at bases ABDOMEN: Soft, non-tender, non-distended MUSCULOSKELETAL:  Moves all 4 limbs independently SKIN: Warm and dry, no edema NEUROLOGIC:  No focal neuro deficits noted. PSYCHIATRIC:  Normal affect    New pertinent results (labs, ECG, imaging, cardiac studies)    Echo personally reviewed: EF normal, moderate LV, RV function mildly reduced. Moderate to severe mitral stenosis with mean gradient 9 mmHg at 84 bpm. Severe MAC.  MVA 0.47 by continuity equation PHT 157, DT 543, MVA 1.4 by PHT/DT  Leaflets/subvalvular apparatus not well visualized but appear at least moderately thickened LA size underestimated on echo measurements  Patient Profile     73 y.o. female without significant prior past cardiac history presenting with acute hypoxic respiratory failure and acute diastolic heart failure, found to have severe mitral stenosis by echo.  Assessment & Plan    Acute hypoxic respiratory failure Acute diastolic heart failure Severe mitral stenosis -images personally reviewed, with manual calculations as above -Unable to calculate wilkins score based on limitations of imaging but available data suggest it will be >8 -admission weight 60.6 kg, current weight 58.1 kg. Charted net negative 2.4 L but intake not well charted. She seems a  little more volume up today, will use RHC to determine diuresis -pending TEE to further define mitral valve/subvalvular apparatus. I suspect this will confirm that she is not a candidate for balloon valvuloplasty based on Wilkins score -then planned for [facility] and surgical consultation for mitral valve replacement. She has diffuse coronary calcification on her CTPE. Discussed that if she has significant coronary disease, she may need CABG at the time of surgery. -I have reached out to CT surgery to see what options/availability we have for surgery in the next week or two. She could potentially be discharged home once optimized and  then return for surgery  MAT/PACs -No definitive evidence of atrial fibrillation thus far. If this does occur, with mitral stenosis, this would be considered valvular atrial fibrillation, therefore warfarin is preferred over DOAC.  -therefore, no indication for anticoagulation at this time. She is at elevated risk of afib with her mitral stenosis and LA dilation, so will need to continue to monitor for this -aim for rate control with severe mitral stenosis  Hypertension: was only on metoprolol as an outpatient. Using this for rate control, as above  Prior tobacco use: quit December 2023    Signed, Jodelle Red, MD  04/11/2023, 10:21 AM

## 2023-04-11 NOTE — Plan of Care (Signed)
  Problem: Education: Goal: Understanding of CV disease, CV risk reduction, and recovery process will improve Outcome: Progressing

## 2023-04-11 NOTE — Plan of Care (Signed)
  Problem: Education: Goal: Knowledge of General Education information will improve Description: Including pain rating scale, medication(s)/side effects and non-pharmacologic comfort measures Outcome: Progressing   Problem: Health Behavior/Discharge Planning: Goal: Ability to manage health-related needs will improve Outcome: Progressing   Problem: Clinical Measurements: Goal: Ability to maintain clinical measurements within normal limits will improve Outcome: Progressing Goal: Will remain free from infection Outcome: Progressing Goal: Diagnostic test results will improve Outcome: Progressing Goal: Respiratory complications will improve Outcome: Progressing Goal: Cardiovascular complication will be avoided Outcome: Progressing   Problem: Activity: Goal: Risk for activity intolerance will decrease Outcome: Progressing   Problem: Nutrition: Goal: Adequate nutrition will be maintained Outcome: Progressing   Problem: Coping: Goal: Level of anxiety will decrease Outcome: Progressing   Problem: Elimination: Goal: Will not experience complications related to bowel motility Outcome: Progressing Goal: Will not experience complications related to urinary retention Outcome: Progressing   Problem: Pain Managment: Goal: General experience of comfort will improve Outcome: Progressing   Problem: Safety: Goal: Ability to remain free from injury will improve Outcome: Progressing   Problem: Skin Integrity: Goal: Risk for impaired skin integrity will decrease Outcome: Progressing   Problem: Education: Goal: Understanding of CV disease, CV risk reduction, and recovery process will improve Outcome: Progressing Goal: Individualized Educational Video(s) Outcome: Progressing   Problem: Activity: Goal: Ability to return to baseline activity level will improve Outcome: Progressing   Problem: Cardiovascular: Goal: Ability to achieve and maintain adequate cardiovascular perfusion  will improve Outcome: Progressing Goal: Vascular access site(s) Level 0-1 will be maintained Outcome: Progressing   Problem: Health Behavior/Discharge Planning: Goal: Ability to safely manage health-related needs after discharge will improve Outcome: Progressing   Problem: Education: Goal: Understanding of CV disease, CV risk reduction, and recovery process will improve Outcome: Progressing Goal: Individualized Educational Video(s) Outcome: Progressing   Problem: Activity: Goal: Ability to return to baseline activity level will improve Outcome: Progressing   Problem: Cardiovascular: Goal: Ability to achieve and maintain adequate cardiovascular perfusion will improve Outcome: Progressing Goal: Vascular access site(s) Level 0-1 will be maintained Outcome: Progressing   Problem: Health Behavior/Discharge Planning: Goal: Ability to safely manage health-related needs after discharge will improve Outcome: Progressing   

## 2023-04-11 NOTE — Consult Note (Signed)
[address]       [place] [address]             [phone]           Tina [name] [facility] Medical Record #[id number] Date of Birth: 11-Apr-1951  No ref. provider found System, Provider Not In  Chief Complaint:    Chief Complaint  Patient presents with   Leg Swelling   Shortness of Breath    History of Present Illness:     Asked to assess this 72 yo female with mitral stenosis. Pt admitted with acute CHF and afib with RVR. She felt that this had come out of the blue and was found to have severe MS with a mean gradient of and severe MAC. Pt had cath with moderate PHTN and admission CT scan with significant aortic calcification. Pt currently managed well medically and plan to be discharged over next 24 hrs.       Past Medical History:  Diagnosis Date   Anxiety    Arthritis    bilateral hips , left hand    Depression    Headache(784.0)    occasional   Pneumonia    hx of     Past Surgical History:  Procedure Laterality Date   CERVICAL FUSION     anterior 7/12    TEE WITHOUT CARDIOVERSION N/A 04/11/2023   Procedure: TRANSESOPHAGEAL ECHOCARDIOGRAM;  Surgeon: [name], MD;  Location: [facility];  Service: Cardiovascular;  Laterality: N/A;   TOTAL HIP ARTHROPLASTY  09/13/2011   Procedure: TOTAL HIP ARTHROPLASTY ANTERIOR APPROACH;  Surgeon: Shelda Pal, MD;  Location: [facility];  Service: Orthopedics;  Laterality: Right;   TOTAL HIP ARTHROPLASTY Left 07/14/2014   Procedure: LEFT TOTAL HIP ARTHROPLASTY ANTERIOR APPROACH;  Surgeon: Shelda Pal, MD;  Location: [facility];  Service: Orthopedics;  Laterality: Left;    Social History   Tobacco Use  Smoking Status Every Day   Current packs/day: 0.25   Average packs/day: 0.3 packs/day for 44.0 years (11.0 ttl pk-yrs)   Types: Cigarettes  Smokeless Tobacco Never    Social History   Substance and Sexual Activity  Alcohol Use Yes   Comment: occasional beer     Social History    Socioeconomic History   Marital status: Divorced    Spouse name: Not on file   Number of children: Not on file   Years of education: Not on file   Highest education level: Not on file  Occupational History   Not on file  Tobacco Use   Smoking status: Every Day    Current packs/day: 0.25    Average packs/day: 0.3 packs/day for 44.0 years (11.0 ttl pk-yrs)    Types: Cigarettes   Smokeless tobacco: Never  Substance and Sexual Activity   Alcohol use: Yes    Comment: occasional beer    Drug use: No    Comment: hx of marijuana   Sexual activity: Not on file  Other Topics Concern   Not on file  Social History Narrative   Not on file   Social Determinants of Health   Financial Resource Strain: Not on file  Food Insecurity: No Food Insecurity (04/09/2023)   Hunger Vital Sign    Worried About Running Out of Food in the Last Year: Never true    Ran Out of Food in the Last Year: Never true  Transportation Needs: No Transportation Needs (04/09/2023)   PRAPARE - Transportation  Lack of Transportation (Medical): No    Lack of Transportation (Non-Medical): No  Physical Activity: Not on file  Stress: Not on file  Social Connections: Not on file  Intimate Partner Violence: Not At Risk (04/09/2023)   Humiliation, Afraid, Rape, and Kick questionnaire    Fear of Current or Ex-Partner: No    Emotionally Abused: No    Physically Abused: No    Sexually Abused: No    No Known Allergies  Current Facility-Administered Medications  Medication Dose Route Frequency Provider Last Rate Last Admin   0.9 %  sodium chloride infusion  250 mL Intravenous PRN [place], Peter M, MD       0.9 %  sodium chloride infusion  250 mL Intravenous PRN [place], Peter M, MD       acetaminophen (TYLENOL) tablet 650 mg  650 mg Oral Q4H PRN [place], Peter M, MD   650 mg at 04/10/23 1523   furosemide (LASIX) tablet 40 mg  40 mg Oral Daily [place], Peter M, MD   40 mg at 04/11/23 1401   heparin injection 5,000 Units   5,000 Units Subcutaneous Q8H [place], Peter M, MD       iohexol (OMNIPAQUE) 350 MG/ML injection 75 mL  75 mL Intravenous Once PRN [place], Peter M, MD       metoprolol tartrate (LOPRESSOR) tablet 12.5 mg  12.5 mg Oral BID [place], Peter M, MD   12.5 mg at 04/11/23 1403   ondansetron (ZOFRAN) injection 4 mg  4 mg Intravenous Q6H PRN [place], Peter M, MD       sodium chloride flush (NS) 0.9 % injection 3 mL  3 mL Intravenous Q12H [place], Peter M, MD   3 mL at 04/11/23 1403   sodium chloride flush (NS) 0.9 % injection 3 mL  3 mL Intravenous PRN [place], Peter M, MD       sodium chloride flush (NS) 0.9 % injection 3 mL  3 mL Intravenous Q12H [place], Peter M, MD   3 mL at 04/11/23 1402   sodium chloride flush (NS) 0.9 % injection 3 mL  3 mL Intravenous PRN [place], Peter M, MD       traZODone (DESYREL) tablet 100 mg  100 mg Oral QHS [place], Peter M, MD   100 mg at 04/10/23 2120     History reviewed. No pertinent family history.     Physical Exam: BP 110/68   Pulse 90   Temp 98 F (36.7 C) (Temporal)   Resp (!) 21   Ht 5\' 4"  (1.626 m)   Wt 58.1 kg   SpO2 100%   BMI 21.99 kg/m      Diagnostic Studies & Laboratory data: I have personally reviewed the following studies and agree with the findings     Recent Radiology Findings:   CARDIAC CATHETERIZATION  Result Date: 04/11/2023   Ost LM lesion is 25% stenosed.   Prox LAD to Mid LAD lesion is 10% stenosed.   Prox Cx to Dist Cx lesion is 15% stenosed.   Prox RCA lesion is 30% stenosed.   LV end diastolic pressure is normal.   Hemodynamic findings consistent with moderate pulmonary hypertension.   There is severe mitral valve stenosis. Nonobstructive CAD Severe mitral stenosis. MV mean gradient 7 mm Hg with MVA 1.37 cm squared. Index 0.84 Severe mitral annular calcification Moderate pulmonary HTN. PAP 59/20 mean 35 mm Hg Low LV filling pressures. PCWP 15/13 mean 10 mm Hg. LVEDP 8 mm Hg Normal cardiac output. 4.05  L/min, index 2.48 Plan: mitral  valve is probably worse than indicated on hemodynamics since LV filling pressures are low and gradient is underestimated. Consider for MVR.   ECHO TEE  Result Date: 04/11/2023    TRANSESOPHOGEAL ECHO REPORT   Patient Name:   Tina [name] Date of Exam: 04/11/2023 Medical Rec #:  [id number]          Height:       64.0 in Accession #:    [id number]         Weight:       128.1 lb Date of Birth:  03-May-1951           BSA:          1.619 m Patient Age:    72 years           BP:           92/68 mmHg Patient Gender: F                  HR:           99 bpm. Exam Location:  Inpatient Procedure: Transesophageal Echo, Color Doppler, Cardiac Doppler and 3D Echo Indications:     Mitral Stenosis I34.2  History:         Patient has prior history of Echocardiogram examinations, most                  recent 04/09/2023.  Sonographer:     Thurman Coyer RDCS Referring Phys:  [id number] BRIDGETTE CHRISTOPHER Diagnosing Phys: [place] PROCEDURE: The transesophogeal probe was passed without difficulty through the esophogus of the patient. Sedation performed by different physician. The patient was monitored while under deep sedation. Anesthestetic sedation was provided intravenously by Anesthesiology: 141.51mg  of Propofol. The patient developed no complications during the procedure.  IMPRESSIONS  1. Left ventricular ejection fraction, by estimation, is 60 to 65%. The left ventricle has normal function.  2. Right ventricular systolic function is normal. The right ventricular size is normal.  3. No left atrial/left atrial appendage thrombus was detected.  4. MVA by planimetery 1.31 cm2 . Mean PG 6 mmHg HR 99 bpm/ BP 92/68 mmHg. P2 appears calcified and restricted. anterior leaflet is degenerative. Consistent with moderate to severe. The mitral valve is degenerative. Trivial mitral valve regurgitation.  5. The aortic valve is normal in structure. Aortic valve regurgitation is not visualized.  6. There is Moderate (Grade III) plaque.  FINDINGS  Left Ventricle: Left ventricular ejection fraction, by estimation, is 60 to 65%. The left ventricle has normal function. The left ventricular internal cavity size was normal in size. Right Ventricle: The right ventricular size is normal. Right ventricular systolic function is normal. Left Atrium: No left atrial/left atrial appendage thrombus was detected. Mitral Valve: MVA by planimetery 1.31 cm2 . Mean PG 6 mmHg HR 99 bpm/ BP 92/68 mmHg. P2 appears calcified and restricted. anterior leaflet is degenerative. Consistent with moderate to severe. The mitral valve is degenerative in appearance. Trivial mitral  valve regurgitation. MV peak gradient, 8.3 mmHg. The mean mitral valve gradient is 4.7 mmHg. Tricuspid Valve: The tricuspid valve is normal in structure. Tricuspid valve regurgitation is trivial. Aortic Valve: The aortic valve is normal in structure. Aortic valve regurgitation is not visualized. Pulmonic Valve: The pulmonic valve was normal in structure. Pulmonic valve regurgitation is not visualized. Aorta: The aortic root is normal in size and structure. There is moderate (Grade III) plaque. IAS/Shunts: No atrial level shunt detected by  color flow Doppler.  MITRAL VALVE MV Area (PHT): 1.48 cm MV Peak grad:  8.3 mmHg MV Mean grad:  4.7 mmHg MV Vmax:       1.44 m/s MV Vmean:      106.2 cm/s Carolan Clines Electronically signed by Carolan Clines Signature Date/Time: 04/11/2023/11:44:08 AM    Final    EP STUDY  Result Date: 04/11/2023 See surgical note for result.     Recent Lab Findings: Lab Results  Component Value Date   WBC 6.8 04/08/2023   HGB 12.6 04/11/2023   HCT 37.0 04/11/2023   PLT 313 04/08/2023   GLUCOSE 99 04/11/2023   ALT 13 04/08/2023   AST 24 04/08/2023   NA 136 04/11/2023   K 3.6 04/11/2023   CL 99 04/11/2023   CREATININE 1.09 (H) 04/11/2023   BUN 20 04/11/2023   CO2 24 04/11/2023   INR 1.08 07/07/2014      Assessment / Plan:     72 yo female with NYHA class 3  symptoms of acute CHF with severe MS, with PHTN and severe MAC with significant ascending aortic calcification. I believe the pt is at a very high risk for surgical MVR and would best be evaluated for BAV. The option for catheter based MV in MAC not really a good option for this pt but would have Dr Lynnette Caffey evaluate if would be potential candidate however they are not perfromed here.   I have spent 60 min in review of the records, viewing studies and in face to face with patient and in coordination of future care    Eugenio Hoes 04/11/2023 2:50 PM

## 2023-04-11 NOTE — Transfer of Care (Signed)
Immediate Anesthesia Transfer of Care Note  Patient: Tina [name]  Procedure(s) Performed: TRANSESOPHAGEAL ECHOCARDIOGRAM  Patient Location: PACU  Anesthesia Type:MAC  Level of Consciousness: drowsy  Airway & Oxygen Therapy: Patient Spontanous Breathing and Patient connected to face mask oxygen  Post-op Assessment: Report given to RN and Post -op Vital signs reviewed and stable  Post vital signs: Reviewed and stable  Last Vitals:  Vitals Value Taken Time  BP 92/68 04/11/23 1045  Temp 36.7 C 04/11/23 1042  Pulse 89 04/11/23 1046  Resp 35 04/11/23 1046  SpO2 99 % 04/11/23 1046  Vitals shown include unfiled device data.  Last Pain:  Vitals:   04/11/23 1042  TempSrc: Temporal  PainSc: 0-No pain      Patients Stated Pain Goal: 0 (04/09/23 0253)  Complications: No notable events documented.

## 2023-04-11 NOTE — Progress Notes (Signed)
PROGRESS NOTE    Tina [name]  MVH:[id number] DOB: [birth year] DOA: 04/08/2023 PCP: System, Provider Not In   Brief Narrative: Tina [name] is a 72 y.o. female with a history of hypertension, hyperlipidemia.  Patient presented secondary to leg swelling and shortness of breath with concern for acute heart failure in addition to atrial fibrillation with RVR converted to sinus rhythm after Cardizem. Patient started on Lasix IV with some improvement of symptoms. Supplemental oxygen provided for associated hypoxia. Transthoracic Echocardiogram obtained and significant for mitral valve stenosis. Cardiology consulted.   Assessment and Plan:  tachyarrhythmia Concern for atrial fibrillation rhythm noted in the ED prior to admission with conversion to normal sinus rhythm with PACs vs MAT after Cardizem bolus and short infusion. Per cardiology, unlikely between PACs and MAT. No anticoagulation started on admission. Eliquis started by cardiology but is now discontinued for now. -Cardiology recommendations: metoprolol  Acute diastolic heart failure Elevated BNP of 722.8 with pleural effusion and interstitial edema; associated dyspnea that improved with Lasix IV. Transthoracic Echocardiogram significant for a normal LVEF of 65-70% but was significant moderate-severe mitral stenosis. Cardiac catheterization on 10/1 was significant for nonobstructive CAD and moderate pulmonary hypertension -Continue Lasix -Cardiology recommendations: diuresis, pending  Mitral valve stenosis Moderate-severe on Transthoracic Echocardiogram and confirmed to be severe on Transesophageal Echocardiogram. -Cardiology recommendations: pending; plan for likely mitral valve replacement  Pulmonary nodules -Located in left lung. Recommendation for non-contrast chest CT in 3-6 months.  Acute respiratory failure with hypoxia Hypoxia with ambulation on room air with SpO2 of 85% per nursing and associated dyspnea. -Wean  to room air as able -Ambulatory pulse ox prior to discharge  Right leg swelling Lower extremity venous duplex negative for DVT.   DVT prophylaxis: Lovenox Code Status:   Code Status: Full Code Family Communication: None at bedside Disposition Plan: Discharge home with home health pending continued cardiology workup/management/recommendations   Consultants:  Cardiology  Procedures:  Transthoracic Echocardiogram  Antimicrobials: None    Subjective: No concerns this afternoon. Procedures went well. A bit tired.  Objective: BP 111/76   Pulse 73   Temp 98 F (36.7 C) (Temporal)   Resp (!) 22   Ht 5\' 4"  (1.626 m)   Wt 58.1 kg   SpO2 (!) 86%   BMI 21.99 kg/m   Examination:  General exam: Appears calm and comfortable Respiratory system: Clear to auscultation. Respiratory effort normal. Cardiovascular system: S1 & S2 heard, irregular rhythm, normal rate Gastrointestinal system: Abdomen is nondistended, soft and nontender. Normal bowel sounds heard. Central nervous system: Alert and oriented. No focal neurological deficits. Musculoskeletal: No edema. No calf tenderness Skin: No cyanosis. No rashes Psychiatry: Judgement and insight appear normal. Mood & affect appropriate.    Data Reviewed: I have personally reviewed following labs and imaging studies  CBC Lab Results  Component Value Date   WBC 6.8 04/08/2023   RBC 4.39 04/08/2023   HGB 12.6 04/11/2023   HCT 37.0 04/11/2023   MCV 91.8 04/08/2023   MCH 28.9 04/08/2023   PLT 313 04/08/2023   MCHC 31.5 04/08/2023   RDW 15.1 04/08/2023   LYMPHSABS 1.3 04/08/2023   MONOABS 0.5 04/08/2023   EOSABS 0.1 04/08/2023   BASOSABS 0.1 04/08/2023     Last metabolic panel Lab Results  Component Value Date   NA 136 04/11/2023   K 3.6 04/11/2023   CL 99 04/11/2023   CO2 24 04/11/2023   BUN 20 04/11/2023   CREATININE 1.09 (H) 04/11/2023   GLUCOSE  99 04/11/2023   GFRNONAA 54 (L) 04/11/2023   GFRAA >90 07/16/2014    CALCIUM 8.5 (L) 04/11/2023   PROT 7.9 04/08/2023   ALBUMIN 3.8 04/08/2023   BILITOT 0.6 04/08/2023   ALKPHOS 50 04/08/2023   AST 24 04/08/2023   ALT 13 04/08/2023   ANIONGAP 12 04/11/2023    GFR: Estimated Creatinine Clearance: 39.7 mL/min (A) (by C-G formula based on SCr of 1.09 mg/dL (H)).  No results found for this or any previous visit (from the past 240 hour(s)).    Radiology Studies: CARDIAC CATHETERIZATION  Result Date: 04/11/2023   Ost LM lesion is 25% stenosed.   Prox LAD to Mid LAD lesion is 10% stenosed.   Prox Cx to Dist Cx lesion is 15% stenosed.   Prox RCA lesion is 30% stenosed.   LV end diastolic pressure is normal.   Hemodynamic findings consistent with moderate pulmonary hypertension.   There is severe mitral valve stenosis. Nonobstructive CAD Severe mitral stenosis. MV mean gradient 7 mm Hg with MVA 1.37 cm squared. Index 0.84 Severe mitral annular calcification Moderate pulmonary HTN. PAP 59/20 mean 35 mm Hg Low LV filling pressures. PCWP 15/13 mean 10 mm Hg. LVEDP 8 mm Hg Normal cardiac output. 4.05 L/min, index 2.48 Plan: mitral valve is probably worse than indicated on hemodynamics since LV filling pressures are low and gradient is underestimated. Consider for MVR.   ECHO TEE  Result Date: 04/11/2023    TRANSESOPHOGEAL ECHO REPORT   Patient Name:   Tina [name] Date of Exam: 04/11/2023 Medical Rec #:  [id number]          Height:       64.0 in Accession #:    [id number]         Weight:       128.1 lb Date of Birth:  1951-03-16           BSA:          1.619 m Patient Age:    72 years           BP:           92/68 mmHg Patient Gender: F                  HR:           99 bpm. Exam Location:  Inpatient Procedure: Transesophageal Echo, Color Doppler, Cardiac Doppler and 3D Echo Indications:     Mitral Stenosis I34.2  History:         Patient has prior history of Echocardiogram examinations, most                  recent 04/09/2023.  Sonographer:     Thurman Coyer RDCS  Referring Phys:  [id number] BRIDGETTE CHRISTOPHER Diagnosing Phys: [place] PROCEDURE: The transesophogeal probe was passed without difficulty through the esophogus of the patient. Sedation performed by different physician. The patient was monitored while under deep sedation. Anesthestetic sedation was provided intravenously by Anesthesiology: 141.51mg  of Propofol. The patient developed no complications during the procedure.  IMPRESSIONS  1. Left ventricular ejection fraction, by estimation, is 60 to 65%. The left ventricle has normal function.  2. Right ventricular systolic function is normal. The right ventricular size is normal.  3. No left atrial/left atrial appendage thrombus was detected.  4. MVA by planimetery 1.31 cm2 . Mean PG 6 mmHg HR 99 bpm/ BP 92/68 mmHg. P2 appears calcified and restricted. anterior leaflet is degenerative. Consistent with moderate  to severe. The mitral valve is degenerative. Trivial mitral valve regurgitation.  5. The aortic valve is normal in structure. Aortic valve regurgitation is not visualized.  6. There is Moderate (Grade III) plaque. FINDINGS  Left Ventricle: Left ventricular ejection fraction, by estimation, is 60 to 65%. The left ventricle has normal function. The left ventricular internal cavity size was normal in size. Right Ventricle: The right ventricular size is normal. Right ventricular systolic function is normal. Left Atrium: No left atrial/left atrial appendage thrombus was detected. Mitral Valve: MVA by planimetery 1.31 cm2 . Mean PG 6 mmHg HR 99 bpm/ BP 92/68 mmHg. P2 appears calcified and restricted. anterior leaflet is degenerative. Consistent with moderate to severe. The mitral valve is degenerative in appearance. Trivial mitral  valve regurgitation. MV peak gradient, 8.3 mmHg. The mean mitral valve gradient is 4.7 mmHg. Tricuspid Valve: The tricuspid valve is normal in structure. Tricuspid valve regurgitation is trivial. Aortic Valve: The aortic valve is normal  in structure. Aortic valve regurgitation is not visualized. Pulmonic Valve: The pulmonic valve was normal in structure. Pulmonic valve regurgitation is not visualized. Aorta: The aortic root is normal in size and structure. There is moderate (Grade III) plaque. IAS/Shunts: No atrial level shunt detected by color flow Doppler.  MITRAL VALVE MV Area (PHT): 1.48 cm MV Peak grad:  8.3 mmHg MV Mean grad:  4.7 mmHg MV Vmax:       1.44 m/s MV Vmean:      106.2 cm/s Carolan Clines Electronically signed by Carolan Clines Signature Date/Time: 04/11/2023/11:44:08 AM    Final    EP STUDY  Result Date: 04/11/2023 See surgical note for result.  ECHOCARDIOGRAM COMPLETE  Result Date: 04/09/2023    ECHOCARDIOGRAM REPORT   Patient Name:   Tina [name] Date of Exam: 04/09/2023 Medical Rec #:  [id number]          Height:       64.0 in Accession #:    [id number]         Weight:       133.6 lb Date of Birth:  12/30/1950           BSA:          1.648 m Patient Age:    72 years           BP:           140/84 mmHg Patient Gender: F                  HR:           88 bpm. Exam Location:  Inpatient Procedure: 2D Echo, Cardiac Doppler and Color Doppler Indications:    CHF-Acute Diastolic I50.31, Abnormal ECG I95.18  History:        Patient has no prior history of Echocardiogram examinations.                 CHF; Arrythmias:Tachycardia.  Sonographer:    Lucendia Herrlich RCS Referring Phys: [phone] JARED M GARDNER IMPRESSIONS  1. Left ventricular ejection fraction, by estimation, is 65 to 70%. The left ventricle has normal function. The left ventricle has no regional wall motion abnormalities. There is moderate left ventricular hypertrophy. Left ventricular diastolic parameters are indeterminate.  2. Right ventricular systolic function is mildly reduced. The right ventricular size is normal. There is mildly elevated pulmonary artery systolic pressure. The estimated right ventricular systolic pressure is 40.0 mmHg.  3. Left atrial size was  moderately dilated.  4. The  mitral valve is degenerative. Trivial mitral valve regurgitation. Moderate to severe mitral stenosis. The mean mitral valve gradient is 9.0 mmHg with average heart rate of 84 bpm. Severe mitral annular calcification.  5. The aortic valve is tricuspid. Aortic valve regurgitation is not visualized. Aortic valve sclerosis/calcification is present, without any evidence of aortic stenosis.  6. The inferior vena cava is dilated in size with >50% respiratory variability, suggesting right atrial pressure of 8 mmHg. FINDINGS  Left Ventricle: Left ventricular ejection fraction, by estimation, is 65 to 70%. The left ventricle has normal function. The left ventricle has no regional wall motion abnormalities. The left ventricular internal cavity size was normal in size. There is  moderate left ventricular hypertrophy. Left ventricular diastolic parameters are indeterminate. Right Ventricle: The right ventricular size is normal. Right vetricular wall thickness was not well visualized. Right ventricular systolic function is mildly reduced. There is mildly elevated pulmonary artery systolic pressure. The tricuspid regurgitant velocity is 2.83 m/s, and with an assumed right atrial pressure of 8 mmHg, the estimated right ventricular systolic pressure is 40.0 mmHg. Left Atrium: Left atrial size was moderately dilated. Right Atrium: Right atrial size was normal in size. Pericardium: Trivial pericardial effusion is present. Mitral Valve: The mitral valve is degenerative in appearance. Severe mitral annular calcification. Trivial mitral valve regurgitation. Moderate to severe mitral valve stenosis. MV peak gradient, 13.1 mmHg. The mean mitral valve gradient is 9.0 mmHg with average heart rate of 84 bpm. Tricuspid Valve: The tricuspid valve is normal in structure. Tricuspid valve regurgitation is trivial. Aortic Valve: The aortic valve is tricuspid. Aortic valve regurgitation is not visualized. Aortic valve  sclerosis/calcification is present, without any evidence of aortic stenosis. Aortic valve mean gradient measures 3.0 mmHg. Aortic valve peak gradient measures 5.4 mmHg. Aortic valve area, by VTI measures 1.76 cm. Pulmonic Valve: The pulmonic valve was not well visualized. Pulmonic valve regurgitation is trivial. Aorta: The aortic root and ascending aorta are structurally normal, with no evidence of dilitation. Venous: The inferior vena cava is dilated in size with greater than 50% respiratory variability, suggesting right atrial pressure of 8 mmHg. IAS/Shunts: The interatrial septum was not well visualized.  LEFT VENTRICLE PLAX 2D LVIDd:         4.90 cm   Diastology LVIDs:         3.50 cm   LV e' medial:    5.35 cm/s LV PW:         1.50 cm   LV E/e' medial:  31.4 LV IVS:        1.30 cm   LV e' lateral:   5.51 cm/s LVOT diam:     1.80 cm   LV E/e' lateral: 30.5 LV SV:         38 LV SV Index:   23 LVOT Area:     2.54 cm  RIGHT VENTRICLE            IVC RV S prime:     8.51 cm/s  IVC diam: 2.20 cm TAPSE (M-mode): 1.6 cm LEFT ATRIUM             Index        RIGHT ATRIUM           Index LA diam:        4.90 cm 2.97 cm/m   RA Area:     12.10 cm LA Vol (A2C):   65.3 ml 39.62 ml/m  RA Volume:   21.90 ml  13.29 ml/m  LA Vol (A4C):   61.0 ml 37.01 ml/m LA Biplane Vol: 67.9 ml 41.20 ml/m  AORTIC VALVE AV Area (Vmax):    2.00 cm AV Area (Vmean):   1.83 cm AV Area (VTI):     1.76 cm AV Vmax:           116.00 cm/s AV Vmean:          76.600 cm/s AV VTI:            0.215 m AV Peak Grad:      5.4 mmHg AV Mean Grad:      3.0 mmHg LVOT Vmax:         91.07 cm/s LVOT Vmean:        55.133 cm/s LVOT VTI:          0.149 m LVOT/AV VTI ratio: 0.69  AORTA Ao Root diam: 2.70 cm Ao Asc diam:  2.90 cm MITRAL VALVE                TRICUSPID VALVE MV Area (PHT): 3.77 cm     TR Peak grad:   32.0 mmHg MV Area VTI:   0.56 cm     TR Vmax:        283.00 cm/s MV Peak grad:  13.1 mmHg MV Mean grad:  9.0 mmHg     SHUNTS MV Vmax:       1.81 m/s      Systemic VTI:  0.15 m MV Vmean:      133.5 cm/s   Systemic Diam: 1.80 cm MV Decel Time: 201 msec MR Peak grad: 48.7 mmHg MR Vmax:      349.00 cm/s MV E velocity: 168.00 cm/s MV A velocity: 199.00 cm/s MV E/A ratio:  0.84 Epifanio Lesches MD Electronically signed by Epifanio Lesches MD Signature Date/Time: 04/09/2023/3:09:16 PM    Final       LOS: 1 day    Jacquelin Hawking, MD [facility] 04/11/2023, 1:12 PM   If 7PM-7AM, please contact night-coverage [email or web address]

## 2023-04-11 NOTE — TOC Initial Note (Signed)
Transition of Care [facility]) - Initial/Assessment Note    Patient Details  Name: Tina [name] MRN: [id number] Date of Birth: [birth year]  Transition of Care [facility]) CM/SW Contact:    Leone Haven, RN Phone Number: 04/11/2023, 6:00 PM  Clinical Narrative:                 From home with son, has PCP and insurance on file, states has no HH services in place at this time, she has cane ,walker at home. Per pt eval rec HHPT.  NCM offered choice, she states she has no preference.  NCM made referral to [facility] with [facility].  He is able to take referral.  Soc will begin 24 to 48hrs post dc.  She states she will be going to her sister's house at dc at the address of [address], [place].   States sister will transport her home at dc and family is support system, states [facility].  Pta ambulatory with walker.   Expected Discharge Plan: Home w Home Health Services Barriers to Discharge: Continued Medical Work up   Patient Goals and CMS Choice Patient states their goals for this hospitalization and ongoing recovery are:: return home CMS Medicare.gov Compare Post Acute Care list provided to:: Patient Choice offered to / list presented to : Patient      Expected Discharge Plan and Services In-house Referral: NA Discharge Planning Services: CM Consult Post Acute Care Choice: Home Health Living arrangements for the past 2 months: Single Family Home                 DME Arranged: N/A DME Agency: NA       HH Arranged: PT HH Agency: [facility] Date [facility] Agency Contacted: 04/11/23 Time HH Agency Contacted: 1630 Representative spoke with at [facility] Agency: Kandee Keen  Prior Living Arrangements/Services Living arrangements for the past 2 months: Single Family Home Lives with:: Adult Children (son) Patient language and need for interpreter reviewed:: Yes Do you feel safe going back to the place where you live?: Yes      Need for Family Participation in Patient  Care: Yes (Comment) Care giver support system in place?: Yes (comment) Current home services: DME (walker, cane) Criminal Activity/Legal Involvement Pertinent to Current Situation/Hospitalization: No - Comment as needed  Activities of Daily Living   ADL Screening (condition at time of admission) Does the patient have a NEW difficulty with bathing/dressing/toileting/self-feeding that is expected to last >3 days?: No Does the patient have a NEW difficulty with getting in/out of bed, walking, or climbing stairs that is expected to last >3 days?: No Does the patient have a NEW difficulty with communication that is expected to last >3 days?: No Is the patient deaf or have difficulty hearing?: No Does the patient have difficulty seeing, even when wearing glasses/contacts?: No Does the patient have difficulty concentrating, remembering, or making decisions?: No  Permission Sought/Granted Permission sought to share information with : Case Manager Permission granted to share information with : Yes, Verbal Permission Granted     Permission granted to share info w AGENCY: Home Health        Emotional Assessment Appearance:: Appears stated age Attitude/Demeanor/Rapport: Engaged Affect (typically observed): Appropriate Orientation: : Oriented to Self, Oriented to Place, Oriented to  Time, Oriented to Situation Alcohol / Substance Use: Not Applicable Psych Involvement: No (comment)  Admission diagnosis:  Atrial fibrillation with rapid ventricular response (HCC) [I48.91] Acute congestive heart failure, unspecified heart  failure type [facility]) [I50.9] Patient Active Problem List   Diagnosis Date Noted   Atrial fibrillation with rapid ventricular response (HCC) 04/10/2023   Ectopic atrial tachycardia (HCC) 04/09/2023   New onset of congestive heart failure (HCC) 04/09/2023   Pulmonary nodules 04/09/2023   Overweight (BMI 25.0-29.9) 07/16/2014   S/P left THA, AA 07/14/2014   PCP:  System,  Provider Not In Pharmacy:   [id number] - [place], [place] - [address] [place] [address] Phone: [phone] Fax: [phone]  [facility] [id number] [place], [place] - [address] [place] [address] Phone: [phone] Fax: [phone]  [id number] [place], [place] - [address] [facility] [address] [place] [address] Phone: [phone] Fax: [phone]  [id number] [place], [place] - [address] [place] [address] Phone: [phone] Fax: [phone]     Social Determinants of Health (SDOH) Social History: SDOH Screenings   Food Insecurity: No Food Insecurity (04/09/2023)  Housing: Low Risk  (04/09/2023)  Transportation Needs: No Transportation Needs (04/09/2023)  Utilities: Not At Risk (04/09/2023)  Tobacco Use: High Risk (04/08/2023)   SDOH Interventions:     Readmission Risk Interventions     No data to display

## 2023-04-11 NOTE — Progress Notes (Signed)
Rounding Note    Patient Name: Tina [name] Date of Encounter: 04/11/2023  [facility] Cardiologist: Jodelle Red, MD   Subjective   Seen prior to her TEE this AM. Reviewed the plan for testing today, she is amenable. Her O2 sats have been around 90 on room air, she says that she keeps getting told to take deep breaths. No pain.  Inpatient Medications    Scheduled Meds:  [MAR Hold] furosemide  40 mg Intravenous BID   [MAR Hold] metoprolol tartrate  12.5 mg Oral BID   [MAR Hold] sodium chloride flush  3 mL Intravenous Q12H   [MAR Hold] traZODone  100 mg Oral QHS   Continuous Infusions:  [MAR Hold] sodium chloride     sodium chloride 1 mL/kg/hr (04/11/23 0518)   PRN Meds: [MAR Hold] sodium chloride, [MAR Hold] acetaminophen, [MAR Hold] iohexol, [MAR Hold] ondansetron (ZOFRAN) IV, [MAR Hold] sodium chloride flush   Vital Signs    Vitals:   04/11/23 0032 04/11/23 0605 04/11/23 0800 04/11/23 0824  BP: 132/68 113/77  119/65  Pulse:  77  78  Resp: 19 19 18 13   Temp: (!) 97.4 F (36.3 C) 98.4 F (36.9 C)  98 F (36.7 C)  TempSrc: Oral Oral  Temporal  SpO2: 95% 94%  96%  Weight:  58.1 kg    Height:        Intake/Output Summary (Last 24 hours) at 04/11/2023 1021 Last data filed at 04/11/2023 0518 Gross per 24 hour  Intake 240 ml  Output 200 ml  Net 40 ml      04/11/2023    6:05 AM 04/10/2023    5:04 AM 04/09/2023    5:22 AM  Last 3 Weights  Weight (lbs) 128 lb 1.4 oz 129 lb 6.6 oz 133 lb 9.6 oz  Weight (kg) 58.1 kg 58.7 kg 60.6 kg      Telemetry    Sinus with intermittent atrial ectopic beats based on p wave morphology, rate controlled - Personally Reviewed  Physical Exam   GEN: Well nourished, well developed in no acute distress NECK: No JVD CARDIAC: regular rhythm, normal S1 and S2, no rubs or gallops. No murmur appreciated. VASCULAR: Radial pulses 2+ bilaterally.  RESPIRATORY:  Clear to auscultation without wheezing or rhonchi in  upper fields, mildly diminished at bases ABDOMEN: Soft, non-tender, non-distended MUSCULOSKELETAL:  Moves all 4 limbs independently SKIN: Warm and dry, no edema NEUROLOGIC:  No focal neuro deficits noted. PSYCHIATRIC:  Normal affect    New pertinent results (labs, ECG, imaging, cardiac studies)    Echo personally reviewed: EF normal, moderate LV, RV function mildly reduced. Moderate to severe mitral stenosis with mean gradient 9 mmHg at 84 bpm. Severe MAC.  MVA 0.47 by continuity equation PHT 157, DT 543, MVA 1.4 by PHT/DT  Leaflets/subvalvular apparatus not well visualized but appear at least moderately thickened LA size underestimated on echo measurements  Patient Profile     72 y.o. female without significant prior past cardiac history presenting with acute hypoxic respiratory failure and acute diastolic heart failure, found to have severe mitral stenosis by echo.  Assessment & Plan    Acute hypoxic respiratory failure Acute diastolic heart failure Severe mitral stenosis -images personally reviewed, with manual calculations as above -Unable to calculate wilkins score based on limitations of imaging but available data suggest it will be >8 -admission weight 60.6 kg, current weight 58.1 kg. Charted net negative 2.4 L but intake not well charted. She seems a  little more volume up today, will use RHC to determine diuresis -pending TEE to further define mitral valve/subvalvular apparatus. I suspect this will confirm that she is not a candidate for balloon valvuloplasty based on Wilkins score -then planned for [facility] and surgical consultation for mitral valve replacement. She has diffuse coronary calcification on her CTPE. Discussed that if she has significant coronary disease, she may need CABG at the time of surgery. -I have reached out to CT surgery to see what options/availability we have for surgery in the next week or two. She could potentially be discharged home once optimized and  then return for surgery  MAT/PACs -No definitive evidence of atrial fibrillation thus far. If this does occur, with mitral stenosis, this would be considered valvular atrial fibrillation, therefore warfarin is preferred over DOAC.  -therefore, no indication for anticoagulation at this time. She is at elevated risk of afib with her mitral stenosis and LA dilation, so will need to continue to monitor for this -aim for rate control with severe mitral stenosis  Hypertension: was only on metoprolol as an outpatient. Using this for rate control, as above  Prior tobacco use: quit December 2023    Signed, Jodelle Red, MD  04/11/2023, 10:21 AM

## 2023-04-11 NOTE — Anesthesia Postprocedure Evaluation (Signed)
Anesthesia Post Note  Patient: Tina [name]  Procedure(s) Performed: TRANSESOPHAGEAL ECHOCARDIOGRAM     Patient location during evaluation: Cath Lab Anesthesia Type: MAC Level of consciousness: awake Pain management: pain level controlled Vital Signs Assessment: post-procedure vital signs reviewed and stable Respiratory status: spontaneous breathing, nonlabored ventilation and respiratory function stable Cardiovascular status: blood pressure returned to baseline and stable Postop Assessment: no apparent nausea or vomiting Anesthetic complications: no   No notable events documented.  Last Vitals:  Vitals:   04/11/23 1330 04/11/23 1400  BP: 125/71 110/68  Pulse: 74 90  Resp: 18 (!) 21  Temp:    SpO2: 100% 100%    Last Pain:  Vitals:   04/11/23 1205  TempSrc:   PainSc: 0-No pain                 Karys Meckley P [name]

## 2023-04-11 NOTE — Anesthesia Preprocedure Evaluation (Addendum)
Anesthesia Evaluation  Patient identified by MRN, date of birth, ID band Patient awake    Reviewed: Allergy & Precautions, NPO status , Patient's Chart, lab work & pertinent test results  Airway Mallampati: III  TM Distance: >3 FB Neck ROM: Full    Dental  (+) Lower Dentures, Upper Dentures   Pulmonary Current Smoker and Patient abstained from smoking.   Pulmonary exam normal        Cardiovascular +CHF  Normal cardiovascular exam+ dysrhythmias + Valvular Problems/Murmurs   ECHO: 1. Left ventricular ejection fraction, by estimation, is 65 to 70%. The  left ventricle has normal function. The left ventricle has no regional  wall motion abnormalities. There is moderate left ventricular hypertrophy.  Left ventricular diastolic  parameters are indeterminate.   2. Right ventricular systolic function is mildly reduced. The right  ventricular size is normal. There is mildly elevated pulmonary artery  systolic pressure. The estimated right ventricular systolic pressure is  40.0 mmHg.   3. Left atrial size was moderately dilated.   4. The mitral valve is degenerative. Trivial mitral valve regurgitation.  Moderate to severe mitral stenosis. The mean mitral valve gradient is 9.0  mmHg with average heart rate of 84 bpm. Severe mitral annular  calcification.   5. The aortic valve is tricuspid. Aortic valve regurgitation is not  visualized. Aortic valve sclerosis/calcification is present, without any  evidence of aortic stenosis.   6. The inferior vena cava is dilated in size with >50% respiratory  variability, suggesting right atrial pressure of 8 mmHg.     Neuro/Psych  Headaches PSYCHIATRIC DISORDERS Anxiety Depression       GI/Hepatic negative GI ROS,,,(+)     substance abuse    Endo/Other  negative endocrine ROS    Renal/GU Renal disease     Musculoskeletal  (+) Arthritis ,    Abdominal   Peds  Hematology negative  hematology ROS (+)   Anesthesia Other Findings severe mitral stenosis  Reproductive/Obstetrics                             Anesthesia Physical Anesthesia Plan  ASA: 4  Anesthesia Plan: MAC   Post-op Pain Management:    Induction: Intravenous  PONV Risk Score and Plan: 1 and Treatment may vary due to age or medical condition and Propofol infusion  Airway Management Planned: Nasal Cannula  Additional Equipment:   Intra-op Plan:   Post-operative Plan:   Informed Consent: I have reviewed the patients History and Physical, chart, labs and discussed the procedure including the risks, benefits and alternatives for the proposed anesthesia with the patient or authorized representative who has indicated his/her understanding and acceptance.     Dental advisory given  Plan Discussed with: CRNA  Anesthesia Plan Comments:        Anesthesia Quick Evaluation

## 2023-04-11 NOTE — CV Procedure (Signed)
INDICATIONS: valve calcification  PROCEDURE:   Informed consent was obtained prior to the procedure. The risks, benefits and alternatives for the procedure were discussed and the patient comprehended these risks.  Risks include, but are not limited to, cough, sore throat, vomiting, nausea, somnolence, esophageal and stomach trauma or perforation, bleeding, low blood pressure, aspiration, pneumonia, infection, trauma to the teeth and death.    After a procedural time-out, the oropharynx was anesthetized with 20% benzocaine spray.   During this procedure the patient was administered propofol (see anesthesia notes) achieve and maintain moderate conscious sedation.  The patient's heart rate, blood pressure, and oxygen saturationweare monitored continuously during the procedure. The period of conscious sedation was 15 minutes, of which I was present face-to-face 100% of this time.  The transesophageal probe was inserted in the esophagus and stomach without difficulty and multiple views were obtained.  The patient was kept under observation until the patient left the procedure room.  The patient left the procedure room in stable condition.   Agitated microbubble saline contrast was not administered.  COMPLICATIONS:    There were no immediate complications.  FINDINGS:  Normal LV function Normal RV function Severe mitral stenosis; restricted P2 MVA planimetry  Mitral annular calcification  RECOMMENDATIONS:     Continue current therapy  Time Spent Directly with the Patient:  15 minutes   Tina [name] 04/11/2023, 10:50 AM

## 2023-04-12 ENCOUNTER — Other Ambulatory Visit (HOSPITAL_COMMUNITY): Payer: Self-pay

## 2023-04-12 ENCOUNTER — Encounter (HOSPITAL_COMMUNITY): Payer: Self-pay | Admitting: Cardiology

## 2023-04-12 DIAGNOSIS — I38 Endocarditis, valve unspecified: Secondary | ICD-10-CM | POA: Diagnosis not present

## 2023-04-12 DIAGNOSIS — I4719 Other supraventricular tachycardia: Secondary | ICD-10-CM | POA: Diagnosis not present

## 2023-04-12 DIAGNOSIS — I5033 Acute on chronic diastolic (congestive) heart failure: Secondary | ICD-10-CM | POA: Diagnosis not present

## 2023-04-12 DIAGNOSIS — I050 Rheumatic mitral stenosis: Secondary | ICD-10-CM | POA: Diagnosis not present

## 2023-04-12 DIAGNOSIS — F32A Depression, unspecified: Secondary | ICD-10-CM | POA: Insufficient documentation

## 2023-04-12 DIAGNOSIS — I5031 Acute diastolic (congestive) heart failure: Secondary | ICD-10-CM | POA: Diagnosis not present

## 2023-04-12 DIAGNOSIS — R918 Other nonspecific abnormal finding of lung field: Secondary | ICD-10-CM | POA: Diagnosis not present

## 2023-04-12 DIAGNOSIS — E876 Hypokalemia: Secondary | ICD-10-CM | POA: Insufficient documentation

## 2023-04-12 LAB — BASIC METABOLIC PANEL
Anion gap: 8 (ref 5–15)
BUN: 18 mg/dL (ref 8–23)
CO2: 26 mmol/L (ref 22–32)
Calcium: 8.3 mg/dL — ABNORMAL LOW (ref 8.9–10.3)
Chloride: 101 mmol/L (ref 98–111)
Creatinine, Ser: 1.05 mg/dL — ABNORMAL HIGH (ref 0.44–1.00)
GFR, Estimated: 56 mL/min — ABNORMAL LOW (ref >60)
Glucose, Bld: 101 mg/dL — ABNORMAL HIGH (ref 70–99)
Potassium: 3.5 mmol/L (ref 3.5–5.1)
Sodium: 135 mmol/L (ref 135–145)

## 2023-04-12 MED ORDER — POTASSIUM CHLORIDE CRYS ER 20 MEQ PO TBCR
20.0000 meq | EXTENDED_RELEASE_TABLET | Freq: Every day | ORAL | Status: DC
  Administered 2023-04-12: 20 meq via ORAL
  Filled 2023-04-12: qty 2

## 2023-04-12 MED ORDER — POTASSIUM CHLORIDE CRYS ER 20 MEQ PO TBCR
20.0000 meq | EXTENDED_RELEASE_TABLET | Freq: Every day | ORAL | 0 refills | Status: AC | PRN
  Filled 2023-04-12: qty 30, 30d supply, fill #0

## 2023-04-12 MED ORDER — FUROSEMIDE 40 MG PO TABS
40.0000 mg | ORAL_TABLET | Freq: Every day | ORAL | 0 refills | Status: AC | PRN
  Filled 2023-04-12: qty 30, 30d supply, fill #0

## 2023-04-12 MED ORDER — METOPROLOL TARTRATE 25 MG PO TABS
12.5000 mg | ORAL_TABLET | Freq: Two times a day (BID) | ORAL | 0 refills | Status: DC
  Filled 2023-04-12: qty 30, 30d supply, fill #0

## 2023-04-12 MED FILL — Verapamil HCl IV Soln 2.5 MG/ML: INTRAVENOUS | Qty: 2 | Status: AC

## 2023-04-12 NOTE — Plan of Care (Signed)
?  Problem: Clinical Measurements: ?Goal: Ability to maintain clinical measurements within normal limits will improve ?Outcome: Progressing ?Goal: Will remain free from infection ?Outcome: Progressing ?Goal: Diagnostic test results will improve ?Outcome: Progressing ?  ?

## 2023-04-12 NOTE — Assessment & Plan Note (Signed)
Atrial fibrillation was ruled out, but patient continue to be at high risk for atrial fibrillation considering her left atrial dilatation and severe mitral valve stenosis.   Plan to continue rate control with metoprolol 12,5 mg po bid.  No current indication for anticoagulation.

## 2023-04-12 NOTE — Discharge Summary (Addendum)
Physician Discharge Summary   Patient: Tina [name] MRN: [id number] DOB: 1950-07-16  Admit date:     04/08/2023  Discharge date: 04/12/23  Discharge Physician: Coralie Keens   PCP: System, Provider Not In   Recommendations at discharge:    Patient will continue metoprolol 12.5 mg po bid for rate control.  Added furosemide as needed for volume overload, weight increase 2 to 3 lbs in 24 hrs or 5 lbs in 7 days.  Added Kcl to take with furosemide.  Follow up with Primary care in 7 to 10 days Follow up renal function in 7 days.  Follow up with Cardiology as scheduled, referral for [facility] cardiothoracic surgery. Follow up on pulmonary nodules.   Discharge Diagnoses: Principal Problem:   Ectopic atrial tachycardia (HCC) Active Problems:   Acute on chronic diastolic CHF (congestive heart failure) (HCC)   Pulmonary nodules  Resolved Problems:   * No resolved hospital problems. [facility] Hospital Course: Tina [name] was admitted to the hospital with the working diagnosis of heart failure decompensation.    73 y.o. female with a history of hypertension, hyperlipidemia.  Patient presented secondary to leg swelling and shortness of breath. Worsening symptoms for the last 7 days prior to admission. On her initial physical examination her blood pressure was 140/84, HR 83 to 68, RR 19 and 02 saturation 97%, lungs with no wheezing, rales or rhonchi, heart with S1 and S2 present, irregularly irregular, abdomen with no distention, positive lower extremity edema.    Na 136, K 3,7 Cl 100, bicarbonate 25, glucose 123, bun 18 cr 1,0  BNP 722 High sensitive troponin 26 and 30  Wbc 6.8 hgb 12.7 plt 313   Chest radiograph with cardiomegaly, bilateral interstitial infiltrates, peripherally, small pleural effusions bilaterally.   CT chest with emphysema, centrilobular and preseptal, positive subpleural fibrotic changes bilaterally. (My interpretation).  Left upper lobe nodule 1.8 x 1,1  cm, lingula nodular density 10 mm.  No pulmonary embolism. Small bilateral pleural effusions.   EKG 97 bpm, right axis deviation, normal intervals, atrial tachycardia, (MAT), with poor R R wave progression with no significant ST segment or T wave changes.   Concern for acute heart failure in addition to atrial fibrillation with RVR converted to sinus rhythm after Cardizem. Patient started on Lasix IV with some improvement of symptoms. Supplemental oxygen provided for associated hypoxia. Transthoracic Echocardiogram obtained and significant for mitral valve stenosis.  TEE with heavy calcified mitral annulus with severe stenosis. \ Cardiac catheterization with non obstructive coronary artery disease.  CT surgery consulted and deemed not candidate for valve intervention.  Plan to follow up with tertiary care, [facility].    Assessment and Plan: * Ectopic atrial tachycardia (HCC) Atrial fibrillation was ruled out, but patient continue to be at high risk for atrial fibrillation considering her left atrial dilatation and severe mitral valve stenosis.   Plan to continue rate control with metoprolol 12,5 mg po bid.  No current indication for anticoagulation.   Acute on chronic diastolic CHF (congestive heart failure) (HCC) Echocardiogram with preserved LV systolic function with EF 65 to 70%, moderate LVH, RV systolic function with mild reduction. RVSP 40 mmHg. LA with moderate dilatation, moderate to severe mitral valve stenosis. Trivial pericardial effusion. No aortic valve stenosis.   10/01 TEE with no left atrial appendage thrombus detected. Mitral valve by planimetry 1,32 cm2. Moderate to severe mitral valve stenosis.   10/01 cardiac catheterization Non obstructive coronary artery disease.  Severe mitral valve stenosis.  MV mean gradient 7 mmHg, with MVA 1,37 cm, and index 0,84.  PAP 59/20 mean 35 mmHg PCWP 15/13 mean 10, LVEDP 8 mmHg.  Cardiac output 4,0 and index 2,4 (Fick).   Pre capillary  pulmonary hypertension.   Mitral valve stenosis is probable worse than indicated on hemodynamics since LC filling pressures are low and gradient is underestimated.   Patient was placed on IV furosemide for diuresis, negative fluid balance was achieved with significant improvement in her symptoms.   CT surgery consulted, patient considered very high risk for surgical MVR and probably better candidate for BVR/MCA.  Plan to continue metoprolol for rate control and follow up as outpatient at tertiary care for MV evaluation.   Pulmonary nodules Follow up CT chest in 3-6 months. Smoking cessation counseling.  CT chest with emphysematous and fibrotic changes, will need outpatient PFT and possible a high resolution CT chest.   Hypokalemia Patient had Kcl for K correction, renal function remained stable. At the time of his discharge her K is 3,5, cr id 1,0 and serum bicarbonate at 26.  Instructed to take Kcl while taking furosemide.  Follow up renal function and electrolytes as outpatient.   Depression Anxiety  Continue with sertraline and trazodone         Consultants: cardiology, cardiothoracic surgery  Procedures performed: TEE and cardiac catheterization L/R  Disposition: Home Diet recommendation:  Discharge Diet Orders (From admission, onward)     Start     Ordered   04/12/23 0000  Diet - low sodium heart healthy        04/12/23 1110           Cardiac diet DISCHARGE MEDICATION: Allergies as of 04/12/2023   No Known Allergies      Medication List     STOP taking these medications    metoprolol succinate 25 MG 24 hr tablet Commonly known as: TOPROL-XL       TAKE these medications    furosemide 40 MG tablet Commonly known as: LASIX Take 1 tablet (40 mg total) by mouth daily as needed for fluid or edema (as needed in case of weight gain 2 to 3 lbs in 24 hrs or 5 lbs in 7 days.).   metoprolol tartrate 25 MG tablet Commonly known as: LOPRESSOR Take 0.5  tablets (12.5 mg total) by mouth 2 (two) times daily.   potassium chloride SA 20 MEQ tablet Commonly known as: KLOR-CON M Take 1 tablet (20 mEq total) by mouth daily as needed (take with furosemide.).   sertraline 50 MG tablet Commonly known as: ZOLOFT Take 50 mg by mouth daily.   traZODone 100 MG tablet Commonly known as: DESYREL Take 100 mg by mouth at bedtime.        Follow-up Information     [facility] Follow up.   Specialty: Home Health Services Why: Agency will contact you to set up apt times Contact information: [address] [place] [address] [phone]                Discharge Exam: Filed Weights   04/10/23 0504 04/11/23 0605 04/12/23 0500  Weight: 58.7 kg 58.1 kg 61.1 kg   BP (!) 118/98 (BP Location: Right Arm)   Pulse 78   Temp 98 F (36.7 C) (Oral)   Resp 18   Ht 5\' 4"  (1.626 m)   Wt 61.1 kg   SpO2 96%   BMI 23.12 kg/m   Patient is feeling better, no dyspnea,  no chest pain, no edema, no PND or orthopnea.   Neurology awake and alert ENT with mild pallor Cardiovascular with S1 and S2 present, regular with no gallops, or rubs, no diastolic or systolic murmurs at the apex No JVD No lower extremity edema Respiratory with no rales or rhonchi, no wheezing Abdomen with no distention   Condition at discharge: stable  The results of significant diagnostics from this hospitalization (including imaging, microbiology, ancillary and laboratory) are listed below for reference.   Imaging Studies: CARDIAC CATHETERIZATION  Result Date: 04/11/2023   Ost LM lesion is 25% stenosed.   Prox LAD to Mid LAD lesion is 10% stenosed.   Prox Cx to Dist Cx lesion is 15% stenosed.   Prox RCA lesion is 30% stenosed.   LV end diastolic pressure is normal.   Hemodynamic findings consistent with moderate pulmonary hypertension.   There is severe mitral valve stenosis. Nonobstructive CAD Severe mitral stenosis. MV mean gradient 7 mm Hg with  MVA 1.37 cm squared. Index 0.84 Severe mitral annular calcification Moderate pulmonary HTN. PAP 59/20 mean 35 mm Hg Low LV filling pressures. PCWP 15/13 mean 10 mm Hg. LVEDP 8 mm Hg Normal cardiac output. 4.05 L/min, index 2.48 Plan: mitral valve is probably worse than indicated on hemodynamics since LV filling pressures are low and gradient is underestimated. Consider for MVR.   ECHO TEE  Result Date: 04/11/2023    TRANSESOPHOGEAL ECHO REPORT   Patient Name:   KOURTNEY TERRIQUEZ Date of Exam: 04/11/2023 Medical Rec #:  [id number]          Height:       64.0 in Accession #:    [id number]         Weight:       128.1 lb Date of Birth:  11/02/1950           BSA:          1.619 m Patient Age:    72 years           BP:           92/68 mmHg Patient Gender: F                  HR:           99 bpm. Exam Location:  Inpatient Procedure: Transesophageal Echo, Color Doppler, Cardiac Doppler and 3D Echo Indications:     Mitral Stenosis I34.2  History:         Patient has prior history of Echocardiogram examinations, most                  recent 04/09/2023.  Sonographer:     Thurman Coyer RDCS Referring Phys:  [id number] BRIDGETTE CHRISTOPHER Diagnosing Phys: [place] PROCEDURE: The transesophogeal probe was passed without difficulty through the esophogus of the patient. Sedation performed by different physician. The patient was monitored while under deep sedation. Anesthestetic sedation was provided intravenously by Anesthesiology: 141.51mg  of Propofol. The patient developed no complications during the procedure.  IMPRESSIONS  1. Left ventricular ejection fraction, by estimation, is 60 to 65%. The left ventricle has normal function.  2. Right ventricular systolic function is normal. The right ventricular size is normal.  3. No left atrial/left atrial appendage thrombus was detected.  4. MVA by planimetery 1.31 cm2 . Mean PG 6 mmHg HR 99 bpm/ BP 92/68 mmHg. P2 appears calcified and restricted. anterior leaflet is  degenerative. Consistent with moderate to severe. The mitral valve is degenerative.  Trivial mitral valve regurgitation.  5. The aortic valve is normal in structure. Aortic valve regurgitation is not visualized.  6. There is Moderate (Grade III) plaque. FINDINGS  Left Ventricle: Left ventricular ejection fraction, by estimation, is 60 to 65%. The left ventricle has normal function. The left ventricular internal cavity size was normal in size. Right Ventricle: The right ventricular size is normal. Right ventricular systolic function is normal. Left Atrium: No left atrial/left atrial appendage thrombus was detected. Mitral Valve: MVA by planimetery 1.31 cm2 . Mean PG 6 mmHg HR 99 bpm/ BP 92/68 mmHg. P2 appears calcified and restricted. anterior leaflet is degenerative. Consistent with moderate to severe. The mitral valve is degenerative in appearance. Trivial mitral  valve regurgitation. MV peak gradient, 8.3 mmHg. The mean mitral valve gradient is 4.7 mmHg. Tricuspid Valve: The tricuspid valve is normal in structure. Tricuspid valve regurgitation is trivial. Aortic Valve: The aortic valve is normal in structure. Aortic valve regurgitation is not visualized. Pulmonic Valve: The pulmonic valve was normal in structure. Pulmonic valve regurgitation is not visualized. Aorta: The aortic root is normal in size and structure. There is moderate (Grade III) plaque. IAS/Shunts: No atrial level shunt detected by color flow Doppler.  MITRAL VALVE MV Area (PHT): 1.48 cm MV Peak grad:  8.3 mmHg MV Mean grad:  4.7 mmHg MV Vmax:       1.44 m/s MV Vmean:      106.2 cm/s Carolan Clines Electronically signed by Carolan Clines Signature Date/Time: 04/11/2023/11:44:08 AM    Final    EP STUDY  Result Date: 04/11/2023 See surgical note for result.  ECHOCARDIOGRAM COMPLETE  Result Date: 04/09/2023    ECHOCARDIOGRAM REPORT   Patient Name:   LESSLIE MOSSA Date of Exam: 04/09/2023 Medical Rec #:  [id number]          Height:       64.0 in  Accession #:    [id number]         Weight:       133.6 lb Date of Birth:  29-Oct-1950           BSA:          1.648 m Patient Age:    72 years           BP:           140/84 mmHg Patient Gender: F                  HR:           88 bpm. Exam Location:  Inpatient Procedure: 2D Echo, Cardiac Doppler and Color Doppler Indications:    CHF-Acute Diastolic I50.31, Abnormal ECG M01.02  History:        Patient has no prior history of Echocardiogram examinations.                 CHF; Arrythmias:Tachycardia.  Sonographer:    Lucendia Herrlich RCS Referring Phys: [phone] JARED M GARDNER IMPRESSIONS  1. Left ventricular ejection fraction, by estimation, is 65 to 70%. The left ventricle has normal function. The left ventricle has no regional wall motion abnormalities. There is moderate left ventricular hypertrophy. Left ventricular diastolic parameters are indeterminate.  2. Right ventricular systolic function is mildly reduced. The right ventricular size is normal. There is mildly elevated pulmonary artery systolic pressure. The estimated right ventricular systolic pressure is 40.0 mmHg.  3. Left atrial size was moderately dilated.  4. The mitral valve is degenerative. Trivial mitral valve regurgitation.  Moderate to severe mitral stenosis. The mean mitral valve gradient is 9.0 mmHg with average heart rate of 84 bpm. Severe mitral annular calcification.  5. The aortic valve is tricuspid. Aortic valve regurgitation is not visualized. Aortic valve sclerosis/calcification is present, without any evidence of aortic stenosis.  6. The inferior vena cava is dilated in size with >50% respiratory variability, suggesting right atrial pressure of 8 mmHg. FINDINGS  Left Ventricle: Left ventricular ejection fraction, by estimation, is 65 to 70%. The left ventricle has normal function. The left ventricle has no regional wall motion abnormalities. The left ventricular internal cavity size was normal in size. There is  moderate left ventricular  hypertrophy. Left ventricular diastolic parameters are indeterminate. Right Ventricle: The right ventricular size is normal. Right vetricular wall thickness was not well visualized. Right ventricular systolic function is mildly reduced. There is mildly elevated pulmonary artery systolic pressure. The tricuspid regurgitant velocity is 2.83 m/s, and with an assumed right atrial pressure of 8 mmHg, the estimated right ventricular systolic pressure is 40.0 mmHg. Left Atrium: Left atrial size was moderately dilated. Right Atrium: Right atrial size was normal in size. Pericardium: Trivial pericardial effusion is present. Mitral Valve: The mitral valve is degenerative in appearance. Severe mitral annular calcification. Trivial mitral valve regurgitation. Moderate to severe mitral valve stenosis. MV peak gradient, 13.1 mmHg. The mean mitral valve gradient is 9.0 mmHg with average heart rate of 84 bpm. Tricuspid Valve: The tricuspid valve is normal in structure. Tricuspid valve regurgitation is trivial. Aortic Valve: The aortic valve is tricuspid. Aortic valve regurgitation is not visualized. Aortic valve sclerosis/calcification is present, without any evidence of aortic stenosis. Aortic valve mean gradient measures 3.0 mmHg. Aortic valve peak gradient measures 5.4 mmHg. Aortic valve area, by VTI measures 1.76 cm. Pulmonic Valve: The pulmonic valve was not well visualized. Pulmonic valve regurgitation is trivial. Aorta: The aortic root and ascending aorta are structurally normal, with no evidence of dilitation. Venous: The inferior vena cava is dilated in size with greater than 50% respiratory variability, suggesting right atrial pressure of 8 mmHg. IAS/Shunts: The interatrial septum was not well visualized.  LEFT VENTRICLE PLAX 2D LVIDd:         4.90 cm   Diastology LVIDs:         3.50 cm   LV e' medial:    5.35 cm/s LV PW:         1.50 cm   LV E/e' medial:  31.4 LV IVS:        1.30 cm   LV e' lateral:   5.51 cm/s LVOT  diam:     1.80 cm   LV E/e' lateral: 30.5 LV SV:         38 LV SV Index:   23 LVOT Area:     2.54 cm  RIGHT VENTRICLE            IVC RV S prime:     8.51 cm/s  IVC diam: 2.20 cm TAPSE (M-mode): 1.6 cm LEFT ATRIUM             Index        RIGHT ATRIUM           Index LA diam:        4.90 cm 2.97 cm/m   RA Area:     12.10 cm LA Vol (A2C):   65.3 ml 39.62 ml/m  RA Volume:   21.90 ml  13.29 ml/m LA Vol (A4C):   61.0 ml  37.01 ml/m LA Biplane Vol: 67.9 ml 41.20 ml/m  AORTIC VALVE AV Area (Vmax):    2.00 cm AV Area (Vmean):   1.83 cm AV Area (VTI):     1.76 cm AV Vmax:           116.00 cm/s AV Vmean:          76.600 cm/s AV VTI:            0.215 m AV Peak Grad:      5.4 mmHg AV Mean Grad:      3.0 mmHg LVOT Vmax:         91.07 cm/s LVOT Vmean:        55.133 cm/s LVOT VTI:          0.149 m LVOT/AV VTI ratio: 0.69  AORTA Ao Root diam: 2.70 cm Ao Asc diam:  2.90 cm MITRAL VALVE                TRICUSPID VALVE MV Area (PHT): 3.77 cm     TR Peak grad:   32.0 mmHg MV Area VTI:   0.56 cm     TR Vmax:        283.00 cm/s MV Peak grad:  13.1 mmHg MV Mean grad:  9.0 mmHg     SHUNTS MV Vmax:       1.81 m/s     Systemic VTI:  0.15 m MV Vmean:      133.5 cm/s   Systemic Diam: 1.80 cm MV Decel Time: 201 msec MR Peak grad: 48.7 mmHg MR Vmax:      349.00 cm/s MV E velocity: 168.00 cm/s MV A velocity: 199.00 cm/s MV E/A ratio:  0.84 Epifanio Lesches MD Electronically signed by Epifanio Lesches MD Signature Date/Time: 04/09/2023/3:09:16 PM    Final    CT Angio Chest PE W and/or Wo Contrast  Result Date: 04/08/2023 CLINICAL DATA:  Shortness of breath, labored breathing with exertion. EXAM: CT ANGIOGRAPHY CHEST WITH CONTRAST TECHNIQUE: Multidetector CT imaging of the chest was performed using the standard protocol during bolus administration of intravenous contrast. Multiplanar CT image reconstructions and MIPs were obtained to evaluate the vascular anatomy. RADIATION DOSE REDUCTION: This exam was performed according to  the departmental dose-optimization program which includes automated exposure control, adjustment of the mA and/or kV according to patient size and/or use of iterative reconstruction technique. CONTRAST:  75mL OMNIPAQUE IOHEXOL 350 MG/ML SOLN COMPARISON:  Chest x-ray today. FINDINGS: Cardiovascular: No filling defects in the pulmonary arteries to suggest pulmonary emboli. Cardiomegaly. Diffusely calcified coronary arteries. Moderate aortic calcifications. No evidence of aortic aneurysm. Small pericardial effusion. Densely calcified mitral valve annulus. Mediastinum/Nodes: Precarinal lymph node has a short axis diameter of 14 mm. Right hilar lymph node has a short axis diameter of 15 mm. No axillary or left hilar adenopathy. Trachea and esophagus are unremarkable. Thyroid unremarkable. Lungs/Pleura: Small bilateral pleural effusions. Dependent bibasilar atelectasis. Mild centrilobular emphysema. Nodular density in the left upper lobe measures 1.8 x 1.1 cm. Favor scarring, but this warrants follow-up. Nodular density in the lingula measuring up to 10 mm. Interstitial prominence peripherally, favor scarring/early fibrosis. Upper Abdomen: No acute abnormality Musculoskeletal: Chest wall soft tissues are unremarkable. No acute bony abnormality. Review of the MIP images confirms the above findings. IMPRESSION: No evidence of pulmonary embolus. Cardiomegaly.  Diffuse coronary artery disease. Small bilateral pleural effusions, bibasilar and dependent atelectasis. Nodular opacities in the left lung. Non-contrast chest CT at 3-6 months is recommended. If the nodules are  stable at time of repeat CT, then future CT at 18-24 months (from today's scan) is considered optional for low-risk patients, but is recommended for high-risk patients. This recommendation follows the consensus statement: Guidelines for Management of Incidental Pulmonary Nodules Detected on CT Images: From the Fleischner Society 2017; Radiology 2017;  284:228-243. Aortic Atherosclerosis (ICD10-I70.0) and Emphysema (ICD10-J43.9). Electronically Signed   By: Charlett Nose M.D.   On: 04/08/2023 22:24   DG Foot Complete Right  Result Date: 04/08/2023 CLINICAL DATA:  Right leg pain.  Ankle swelling.  No known injury. EXAM: RIGHT FOOT COMPLETE - 3+ VIEW COMPARISON:  None Available. 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 Old fracture fragment off the tip of the lateral malleolus which is well corticated. No acute fracture, subluxation or dislocation. Soft tissues are intact. IMPRESSION: No acute bony abnormality. Electronically Signed   By: Charlett Nose M.D.   On: 04/08/2023 21:49   DG Ankle Complete Right  Result Date: 04/08/2023 CLINICAL DATA:  Right ankle swelling, pain.  No known injury. EXAM: RIGHT ANKLE - COMPLETE 3+ VIEW COMPARISON:  None Available. FINDINGS: Old lateral malleolar fracture. No acute fracture, subluxation or dislocation. Mild spurring within the right ankle joint. Diffuse soft tissue swelling. IMPRESSION: No acute bony abnormality. Electronically Signed   By: Charlett Nose M.D.   On: 04/08/2023 21:48   DG Chest Port 1 View  Result Date: 04/08/2023 CLINICAL DATA:  Shortness of breath EXAM: PORTABLE CHEST 1 VIEW COMPARISON:  None Available. FINDINGS: Cardiomegaly. Diffuse interstitial prominence throughout the lungs may reflect interstitial edema. Small bilateral pleural effusions. No acute bony abnormality. IMPRESSION: Cardiomegaly with diffuse interstitial prominence, likely interstitial edema. Small bilateral pleural effusions. Electronically Signed   By: Charlett Nose M.D.   On: 04/08/2023 21:48   US Venous Img Lower Unilateral Right  Result Date: 04/08/2023 CLINICAL DATA:  Right leg swelling EXAM: RIGHT LOWER EXTREMITY VENOUS DOPPLER ULTRASOUND TECHNIQUE: Gray-scale sonography with graded compression, as well as color Doppler and duplex ultrasound were performed to evaluate the lower extremity deep venous systems  from the level of the common femoral vein and including the common femoral, femoral, profunda femoral, popliteal and calf veins including the posterior tibial, peroneal and gastrocnemius veins when visible. The superficial great saphenous vein was also interrogated. Spectral Doppler was utilized to evaluate flow at rest and with distal augmentation maneuvers in the common femoral, femoral and popliteal veins. COMPARISON:  None Available. FINDINGS: Contralateral Common Femoral Vein: Respiratory phasicity is normal and symmetric with the symptomatic side. No evidence of thrombus. Normal compressibility. Common Femoral Vein: No evidence of thrombus. Normal compressibility, respiratory phasicity and response to augmentation. Saphenofemoral Junction: No evidence of thrombus. Normal compressibility and flow on color Doppler imaging. Profunda Femoral Vein: No evidence of thrombus. Normal compressibility and flow on color Doppler imaging. Femoral Vein: Not well visualized distally due to overlying edema. Popliteal Vein: No evidence of thrombus. Normal compressibility, respiratory phasicity and response to augmentation. Calf Veins: Limited visualization due to overlying edema. Superficial Great Saphenous Vein: No evidence of thrombus. Normal compressibility. Venous Reflux:  None. Other Findings:  None. IMPRESSION: Somewhat limited exam to overlying edema and patient discomfort. No definitive thrombus is noted. Electronically Signed   By: Alcide Clever M.D.   On: 04/08/2023 21:22    Microbiology: Results for orders placed or performed during the hospital encounter of 07/07/14  Surgical pcr screen     Status: None   Collection Time: 07/07/14  1:50 PM   Specimen: Nasal  Mucosa; Nasal Swab  Result Value Ref Range Status   MRSA, PCR NEGATIVE NEGATIVE Final   Staphylococcus aureus NEGATIVE NEGATIVE Final    Comment:        The Xpert SA Assay (FDA approved for NASAL specimens in patients over 45 years of age), is one  component of a comprehensive surveillance program.  Test performance has been validated by [employer] for patients greater than or equal to 48 year old. It is not intended to diagnose infection nor to guide or monitor treatment.     Labs: CBC: Recent Labs  Lab 04/08/23 2027 04/11/23 1151 04/11/23 1152 04/11/23 1156 04/11/23 1535  WBC 6.8  --   --   --  6.7  NEUTROABS 4.8  --   --   --   --   HGB 12.7 12.9 12.9 12.6 13.3  HCT 40.3 38.0 38.0 37.0 42.8  MCV 91.8  --   --   --  94.1  PLT 313  --   --   --  180   Basic Metabolic Panel: Recent Labs  Lab 04/08/23 2027 04/10/23 0400 04/10/23 0747 04/11/23 0419 04/11/23 1151 04/11/23 1152 04/11/23 1156 04/11/23 1535 04/12/23 0301  NA 136 135  --  135 138 138 136  --  135  K 3.7 3.1*  --  3.6 3.6 3.7 3.6  --  3.5  CL 100 98  --  99  --   --   --   --  101  CO2 25 26  --  24  --   --   --   --  26  GLUCOSE 123* 88  --  99  --   --   --   --  101*  BUN 18 17  --  20  --   --   --   --  18  CREATININE 1.02* 1.11*  --  1.09*  --   --   --  0.96 1.05*  CALCIUM 8.9 8.4*  --  8.5*  --   --   --   --  8.3*  MG  --   --  1.9  --   --   --   --   --   --    Liver Function Tests: Recent Labs  Lab 04/08/23 2027  AST 24  ALT 13  ALKPHOS 50  BILITOT 0.6  PROT 7.9  ALBUMIN 3.8   CBG: No results for input(s): "GLUCAP" in the last 168 hours.  Discharge time spent: greater than 30 minutes.  Signed: Coralie Keens, MD [facility] 04/12/2023

## 2023-04-12 NOTE — Assessment & Plan Note (Signed)
Anxiety  Continue with sertraline and trazodone

## 2023-04-12 NOTE — Progress Notes (Signed)
Physical Therapy Treatment Patient Details Name: Tina [name] MRN: [id number] DOB: [birth year] Today's Date: 04/12/2023   History of Present Illness 72 yo female presents to [facility] on 9/28 with R ankle swelling, DOE. Pt found to have ectopic atrial tachycardia, new dx CHF. PMH includes HTN, HLD, anxiety, depression, OA, tobacco use, BL THA.    PT Comments  Patient is agreeable to PT session. She is eager to go home. The patient had increased ambulation distance today compared to last session. Mild dyspnea with exertion with one short standing rest break required. Patient educated on pacing and other energy conservation strategies. Encouraged patient to continue using her 4 wheeled walker and education on how to use the walker for safely for seated rest breaks as needed with household ambulation. Recommend to continue PT to maximize independence and decrease caregiver burden.    If plan is discharge home, recommend the following: A little help with walking and/or transfers;A little help with bathing/dressing/bathroom;Help with stairs or ramp for entrance;Assist for transportation;Assistance with cooking/housework   Can travel by private vehicle        Equipment Recommendations  None recommended by PT    Recommendations for Other Services       Precautions / Restrictions Precautions Precautions: Fall Precaution Comments: monitor HR Restrictions Weight Bearing Restrictions: No     Mobility  Bed Mobility Overal bed mobility: Needs Assistance Bed Mobility: Supine to Sit, Sit to Supine     Supine to sit: Supervision Sit to supine: Contact guard assist   General bed mobility comments: cues for task initiation    Transfers Overall transfer level: Needs assistance Equipment used: Rollator (4 wheels) Transfers: Sit to/from Stand Sit to Stand: Supervision           General transfer comment: verbal cues for hand placement with good safety awareness demonstrated     Ambulation/Gait Ambulation/Gait assistance: Contact guard assist Gait Distance (Feet): 100 Feet Assistive device: Rollator (4 wheels) Gait Pattern/deviations: Step-through pattern, Decreased stride length, Trunk flexed Gait velocity: decreased     General Gait Details: hear rate up to 76bpm with activity. patient has mild dyspnea with one standing rest break required with activity on room air. patient educated on energy conservation strategies, pacing for endurance, walking schedule for home   Stairs             Wheelchair Mobility     Tilt Bed    Modified Rankin (Stroke Patients Only)       Balance Overall balance assessment: Needs assistance Sitting-balance support: No upper extremity supported, Feet supported Sitting balance-Leahy Scale: Good     Standing balance support: Bilateral upper extremity supported, During functional activity Standing balance-Leahy Scale: Fair Standing balance comment: inteirmttently using rollator for support in stanidng                            Cognition Arousal: Alert Behavior During Therapy: WFL for tasks assessed/performed Overall Cognitive Status: Within Functional Limits for tasks assessed                                 General Comments: tangential at times during session, easily redirected        Exercises      General Comments        Pertinent Vitals/Pain Pain Assessment Pain Assessment: No/denies pain    Home Living  Prior Function            PT Goals (current goals can now be found in the care plan section) Acute Rehab PT Goals Patient Stated Goal: to go home PT Goal Formulation: With patient Time For Goal Achievement: 04/24/23 Potential to Achieve Goals: Good Progress towards PT goals: Progressing toward goals    Frequency    Min 1X/week      PT Plan      Co-evaluation              AM-PAC PT "6 Clicks" Mobility    Outcome Measure  Help needed turning from your back to your side while in a flat bed without using bedrails?: A Little Help needed moving from lying on your back to sitting on the side of a flat bed without using bedrails?: A Little Help needed moving to and from a bed to a chair (including a wheelchair)?: A Little Help needed standing up from a chair using your arms (e.g., wheelchair or bedside chair)?: A Little Help needed to walk in hospital room?: A Little Help needed climbing 3-5 steps with a railing? : A Lot 6 Click Score: 17    End of Session   Activity Tolerance: Patient tolerated treatment well;Patient limited by fatigue Patient left: in bed;with call bell/phone within reach;with bed alarm set (MD in the room) Nurse Communication: Mobility status PT Visit Diagnosis: Other abnormalities of gait and mobility (R26.89);Muscle weakness (generalized) (M62.81)     Time: 1093-2355 PT Time Calculation (min) (ACUTE ONLY): 20 min  Charges:    $Therapeutic Activity: 8-22 mins PT General Charges $$ ACUTE PT VISIT: 1 Visit                     Donna Bernard, PT, MPT   Ina Homes 04/12/2023, 9:45 AM

## 2023-04-12 NOTE — Assessment & Plan Note (Signed)
Echocardiogram with preserved LV systolic function with EF 65 to 70%, moderate LVH, RV systolic function with mild reduction. RVSP 40 mmHg. LA with moderate dilatation, moderate to severe mitral valve stenosis. Trivial pericardial effusion. No aortic valve stenosis.   10/01 TEE with no left atrial appendage thrombus detected. Mitral valve by planimetry 1,32 cm2. Moderate to severe mitral valve stenosis.   10/01 cardiac catheterization Non obstructive coronary artery disease.  Severe mitral valve stenosis. MV mean gradient 7 mmHg, with MVA 1,37 cm, and index 0,84.  PAP 59/20 mean 35 mmHg PCWP 15/13 mean 10, LVEDP 8 mmHg.  Cardiac output 4,0 and index 2,4 (Fick).   Pre capillary pulmonary hypertension.   Mitral valve stenosis is probable worse than indicated on hemodynamics since LC filling pressures are low and gradient is underestimated.   Patient was placed on IV furosemide for diuresis, negative fluid balance was achieved with significant improvement in her symptoms.   CT surgery consulted, patient considered very high risk for surgical MVR and probably better candidate for BVR/MCA.  Plan to continue metoprolol for rate control and follow up as outpatient at tertiary care for MV evaluation.

## 2023-04-12 NOTE — TOC Transition Note (Signed)
Transition of Care [facility]) - CM/SW Discharge Note   Patient Details  Name: RAELYN RACETTE MRN: [id number] Date of Birth: Jul 28, 1950  Transition of Care [facility] CM/SW Contact:  Leone Haven, RN Phone Number: 04/12/2023, 11:29 AM   Clinical Narrative:    She is for dc today, she is set up with [facility] for HHPT.  She states her sister will be transporting her home at dc.    Final next level of care: Home w Home Health Services Barriers to Discharge: Continued Medical Work up   Patient Goals and CMS Choice CMS Medicare.gov Compare Post Acute Care list provided to:: Patient Choice offered to / list presented to : Patient  Discharge Placement                         Discharge Plan and Services Additional resources added to the After Visit Summary for   In-house Referral: NA Discharge Planning Services: CM Consult Post Acute Care Choice: Home Health          DME Arranged: N/A DME Agency: NA       HH Arranged: PT HH Agency: [facility] Date [facility] Agency Contacted: 04/11/23 Time HH Agency Contacted: 1630 Representative spoke with at [facility] Agency: Kandee Keen  Social Determinants of Health (SDOH) Interventions SDOH Screenings   Food Insecurity: No Food Insecurity (04/09/2023)  Housing: Low Risk  (04/09/2023)  Transportation Needs: No Transportation Needs (04/09/2023)  Utilities: Not At Risk (04/09/2023)  Tobacco Use: High Risk (04/08/2023)     Readmission Risk Interventions     No data to display

## 2023-04-12 NOTE — Progress Notes (Signed)
Rounding Note    Patient Name: Tina [name] Date of Encounter: 04/12/2023  [facility] Cardiologist: Jodelle Red, MD   Subjective   Reviewed events from yesterday. TEE with heavily calcified mitral annulus. Posterior leaflet calcified and restricted. Severe stenosis by MVA. Cath shows nonobstructive CAD, severe mitral stenosis, low filling pressures, moderate pulmonary hypertension, normal output/index. Evaluated by Dr. Leafy Ro, deemed not a surgical candidate.  She was up and ambulating this AM, had fatigue/shortness of breath but O2 remained stable. We discussed referral for second opinion at [facility]. She wants to pursue surgery if at all possible and is open to referral.  Inpatient Medications    Scheduled Meds:  furosemide  40 mg Oral Daily   heparin  5,000 Units Subcutaneous Q8H   metoprolol tartrate  12.5 mg Oral BID   sodium chloride flush  3 mL Intravenous Q12H   sodium chloride flush  3 mL Intravenous Q12H   traZODone  100 mg Oral QHS   Continuous Infusions:  sodium chloride     sodium chloride     PRN Meds: sodium chloride, sodium chloride, acetaminophen, iohexol, ondansetron (ZOFRAN) IV, sodium chloride flush, sodium chloride flush   Vital Signs    Vitals:   04/12/23 0010 04/12/23 0430 04/12/23 0500 04/12/23 0851  BP: (!) 101/48 125/60  (!) 118/98  Pulse: 67 64  78  Resp: 19 17 17 18   Temp: 98.1 F (36.7 C) 98 F (36.7 C)  98 F (36.7 C)  TempSrc: Oral Oral  Oral  SpO2: 100% 93%  96%  Weight:   61.1 kg   Height:        Intake/Output Summary (Last 24 hours) at 04/12/2023 0949 Last data filed at 04/12/2023 0500 Gross per 24 hour  Intake 1055.17 ml  Output 500 ml  Net 555.17 ml      04/12/2023    5:00 AM 04/11/2023    6:05 AM 04/10/2023    5:04 AM  Last 3 Weights  Weight (lbs) 134 lb 11.2 oz 128 lb 1.4 oz 129 lb 6.6 oz  Weight (kg) 61.1 kg 58.1 kg 58.7 kg      Telemetry    Sinus with intermittent atrial ectopic beats  based on p wave morphology, rate controlled - Personally Reviewed  Physical Exam   GEN: Well nourished, well developed in no acute distress NECK: No JVD CARDIAC: regular rhythm, normal S1 and S2, no rubs or gallops. Soft diastolic rumble heard today. VASCULAR: Radial pulses 2+ bilaterally.  RESPIRATORY:  Clear to auscultation without rales, wheezing or rhonchi  ABDOMEN: Soft, non-tender, non-distended MUSCULOSKELETAL:  Moves all 4 limbs independently SKIN: Warm and dry, no edema NEUROLOGIC:  No focal neuro deficits noted. PSYCHIATRIC:  Normal affect    New pertinent results (labs, ECG, imaging, cardiac studies)    Echo personally reviewed: EF normal, moderate LV, RV function mildly reduced. Moderate to severe mitral stenosis with mean gradient 9 mmHg at 84 bpm. Severe MAC.  MVA 0.47 by continuity equation PHT 157, DT 543, MVA 1.4 by PHT/DT  Leaflets/subvalvular apparatus not well visualized but appear at least moderately thickened LA size underestimated on echo measurements  TEE with heavily calcified mitral annulus. Posterior leaflet calcified and restricted. Severe stenosis by MVA.   Cath shows nonobstructive CAD, severe mitral stenosis, low filling pressures, moderate pulmonary hypertension, normal output/index.   Patient Profile     72 y.o. female without significant prior past cardiac history presenting with acute hypoxic respiratory failure and acute diastolic  heart failure, found to have severe mitral stenosis by echo.  Assessment & Plan    Acute hypoxic respiratory failure Acute diastolic heart failure Severe mitral stenosis -images personally reviewed, with manual calculations as above -wilkins score not favorable for balloon valvuloplasty -TEE with heavily calcified mitral annulus. Posterior leaflet calcified and restricted. Severe stenosis by MVA.  -Cath shows nonobstructive CAD, severe mitral stenosis, low filling pressures, moderate pulmonary hypertension, normal  output/index.  -Evaluated by Dr. Leafy Ro, deemed not a surgical candidate. -I have reached out to Dr. Lynnette Caffey to determine if valve in MAC is [name] option for her. However, first choice would be for surgical MVR if possible. I discussed referral to [facility] for a second opinion for MVR. She is amenable to this.  Spoke to the [facility] surgery center. Fax number [phone], requesting Dr. Silvestre Mesi. Will send images to powershare and demographics/note to the scheduling team.  MAT/PACs -No definitive evidence of atrial fibrillation thus far. If this does occur, with mitral stenosis, this would be considered valvular atrial fibrillation, therefore warfarin is preferred over DOAC.  -therefore, no indication for anticoagulation at this time. She is at elevated risk of afib with her mitral stenosis and LA dilation, so will need to continue to monitor for this -aim for rate control with severe mitral stenosis  Hypertension: was only on metoprolol as [name] outpatient. Using this for rate control, as above  Prior tobacco use: quit December 2023    Signed, Jodelle Red, MD  04/12/2023, 9:49 AM

## 2023-04-12 NOTE — Progress Notes (Signed)
Heart Failure Navigator Progress Note  Assessed for Heart & Vascular TOC clinic readiness.  Patient does not meet criteria due to EF 65-70%, has a scheduled [facility] appointment on 04/28/2023. .   Navigator will sign off at this time.   Rhae Hammock, BSN, Scientist, clinical (histocompatibility and immunogenetics) Only

## 2023-04-12 NOTE — Assessment & Plan Note (Signed)
Patient had Kcl for K correction, renal function remained stable. At the time of his discharge her K is 3,5, cr id 1,0 and serum bicarbonate at 26.  Instructed to take Kcl while taking furosemide.  Follow up renal function and electrolytes as outpatient.

## 2023-04-12 NOTE — Progress Notes (Signed)
Pt clothes are soiled and sister will be over at 3pm to transport how.

## 2023-04-13 LAB — LIPOPROTEIN A (LPA): Lipoprotein (a): <8.4 nmol/L (ref <75.0)

## 2023-04-14 ENCOUNTER — Telehealth (HOSPITAL_BASED_OUTPATIENT_CLINIC_OR_DEPARTMENT_OTHER): Payer: Self-pay

## 2023-04-14 NOTE — Telephone Encounter (Addendum)
Called patient at request of MD, she has not heard from [place]. She will be at appointment.    ----- Message from [facility] sent at 04/14/2023 10:14 AM EDT ----- Regarding: follow up Hey, can you do me a favor and call this patient? First, I want to make sure she's heard from [facility] about scheduling an appt to discuss valve surgery. Second, she was discharged before we got her our follow up appt, so if you can let her know that she has an appt with me on 10/18 at 9 AM (I know it's an imaging day, there was nothing with me or Luther Parody until mid November). Just want to close the loop. She's very nice. Thanks!

## 2023-04-28 ENCOUNTER — Ambulatory Visit (INDEPENDENT_AMBULATORY_CARE_PROVIDER_SITE_OTHER): Payer: Medicare HMO | Admitting: Cardiology

## 2023-04-28 ENCOUNTER — Encounter (HOSPITAL_BASED_OUTPATIENT_CLINIC_OR_DEPARTMENT_OTHER): Payer: Self-pay | Admitting: Cardiology

## 2023-04-28 VITALS — BP 102/62 | HR 49 | Ht 64.0 in | Wt 133.6 lb

## 2023-04-28 DIAGNOSIS — I5032 Chronic diastolic (congestive) heart failure: Secondary | ICD-10-CM

## 2023-04-28 DIAGNOSIS — Z09 Encounter for follow-up examination after completed treatment for conditions other than malignant neoplasm: Secondary | ICD-10-CM | POA: Diagnosis not present

## 2023-04-28 DIAGNOSIS — I38 Endocarditis, valve unspecified: Secondary | ICD-10-CM

## 2023-04-28 DIAGNOSIS — I050 Rheumatic mitral stenosis: Secondary | ICD-10-CM

## 2023-04-28 DIAGNOSIS — I4719 Other supraventricular tachycardia: Secondary | ICD-10-CM

## 2023-04-28 MED ORDER — METOPROLOL TARTRATE 25 MG PO TABS
12.5000 mg | ORAL_TABLET | Freq: Two times a day (BID) | ORAL | 11 refills | Status: DC
Start: 2023-04-28 — End: 2023-11-06

## 2023-04-28 NOTE — Progress Notes (Signed)
Cardiology Office Note:  .   Date:  04/28/2023  ID:  [name], [name] 1950-11-21, MRN [id number] PCP: System, Provider Not In  [facility] Providers Cardiologist:  Jodelle Red, MD {  History of Present Illness: Marland Kitchen   Tina [name] is a 72 y.o. female with severe mitral stenosis here for follow up.  Pertinent CV history: I met her 03/2023 in the hospital, where she presented with acute hypoxic respiratory failure. Testing revealed severe mitral stenosis. She did not have a prior cardiac history. She underwent TTE, TEE, and R/LHC (see below) consistent with severe mitral stenosis. She was seen by Dr. Leafy Ro while admitted and deemed not a surgical candidate. Dr. Lynnette Caffey also reviewed her images for possible percutaneous intervention. She was referred to [facility] to meet with Dr. Silvestre Mesi for a second opinion.  Today: Here with daughter and daughter in law today. Has been feeling much better since leaving the hospital. Breathing is back to baseline. No swelling or significant weight gain, has been weighing daily. Has not required lasix/potassium since discharge. No palpitations.  She and her family have many questions today. We reviewed the events of her hospitalization, findings from her testing, and plans for evaluation with Dr. Silvestre Mesi. They are in agreement and all questions were answered.  ROS: Denies chest pain, shortness of breath at rest. No PND, orthopnea, LE edema or unexpected weight gain. No syncope or palpitations. ROS otherwise negative except as noted.   Studies Reviewed: Marland Kitchen    EKG:       Physical Exam:   VS:  BP 102/62   Pulse (!) 49   Ht 5\' 4"  (1.626 m)   Wt 133 lb 9.6 oz (60.6 kg)   SpO2 94%   BMI 22.93 kg/m    Wt Readings from Last 3 Encounters:  04/28/23 133 lb 9.6 oz (60.6 kg)  04/12/23 134 lb 11.2 oz (61.1 kg)  07/14/14 174 lb (78.9 kg)    GEN: Well nourished, well developed in no acute distress HEENT: Normal, moist mucous membranes NECK:  No JVD CARDIAC: regular rhythm with once ectopic beat, normal S1 and S2, no rubs or gallops. Did not appreciate diastolic rumble. VASCULAR: Radial and DP pulses 2+ bilaterally. No carotid bruits RESPIRATORY:  Clear to auscultation without rales, wheezing or rhonchi  ABDOMEN: Soft, non-tender, non-distended MUSCULOSKELETAL:  Ambulates independently SKIN: Warm and dry, no edema NEUROLOGIC:  Alert and oriented x 3. No focal neuro deficits noted. PSYCHIATRIC:  Normal affect    ASSESSMENT AND PLAN: .    Severe mitral stenosis -Echo personally reviewed: EF normal, moderate LV, RV function mildly reduced. Moderate to severe mitral stenosis with mean gradient 9 mmHg at 84 bpm. Severe MAC. MVA 0.47 by continuity equation. PHT 157, DT 543, MVA 1.4 by PHT/DT. Leaflets/subvalvular apparatus not well visualized but appear at least moderately thickened. LA size underestimated on echo measurements -wilkins score not favorable for balloon valvuloplasty -TEE with heavily calcified mitral annulus. Posterior leaflet calcified and restricted. Severe stenosis by MVA.  -Cath shows nonobstructive CAD, severe mitral stenosis, low filling pressures, moderate pulmonary hypertension, normal output/index.  -Evaluated by Dr. Leafy Ro, deemed not a surgical candidate. -Dr. Lynnette Caffey reviewed to determine if valve in MAC is an option for her. However, first choice would be for surgical MVR if possible. She is pending evaluation with Dr. Silvestre Mesi at [facility]. -doing much better post discharge, edema has not returned, breathing remains stable -reviewed reason for metoprolol, goal for heart rate -reviewed red flag warning  signs that need immediate medical attention   MAT/PACs -No definitive evidence of atrial fibrillation thus far. If this does occur, with mitral stenosis, this would be considered valvular atrial fibrillation, therefore warfarin is preferred over DOAC. But there is no indication for anticoagulation at this time.   -aim for rate control with severe mitral stenosis   Hypertension: was only on metoprolol as an outpatient. Using this for rate control, as above   Prior tobacco use: quit December 2023  CV risk counseling and prevention -recommend heart healthy/Mediterranean diet, with whole grains, fruits, vegetable, fish, lean meats, nuts, and olive oil. Limit salt. -recommend moderate walking, 3-5 times/week for 30-50 minutes each session. Aim for at least 150 minutes.week. Goal should be pace of 3 miles/hours, or walking 1.5 miles in 30 minutes -recommend avoidance of tobacco products. Avoid excess alcohol.  Dispo: 3 mos or sooner based on evaluation with Dr. Silvestre Mesi  Signed, Jodelle Red, MD   Jodelle Red, MD, PhD, [facility]  [facility]  [place]  Heart & Vascular at [facility] at [facility] [address] [place], [place] [address] [phone]

## 2023-04-28 NOTE — Patient Instructions (Signed)
Medication Instructions:  Your physician recommends that you continue on your current medications as directed. Please refer to the Current Medication list given to you today.   *If you need a refill on your cardiac medications before your next appointment, please call your pharmacy*  Follow-Up: At [facility], you and your health needs are our priority.  As part of our continuing mission to provide you with exceptional heart care, we have created designated Provider Care Teams.  These Care Teams include your primary Cardiologist (physician) and Advanced Practice Providers (APPs -  Physician Assistants and Nurse Practitioners) who all work together to provide you with the care you need, when you need it.  We recommend signing up for the patient portal called "MyChart".  Sign up information is provided on this After Visit Summary.  MyChart is used to connect with patients for Virtual Visits (Telemedicine).  Patients are able to view lab/test results, encounter notes, upcoming appointments, etc.  Non-urgent messages can be sent to your provider as well.   To learn more about what you can do with MyChart, go to [email or web address].    Your next appointment:   Follow up in 3 months with Dr. Cristal Deer.

## 2023-05-22 ENCOUNTER — Ambulatory Visit (HOSPITAL_BASED_OUTPATIENT_CLINIC_OR_DEPARTMENT_OTHER): Payer: Medicare HMO | Admitting: Family

## 2023-08-04 ENCOUNTER — Ambulatory Visit (HOSPITAL_BASED_OUTPATIENT_CLINIC_OR_DEPARTMENT_OTHER): Payer: Medicare HMO | Admitting: Cardiology

## 2023-11-06 ENCOUNTER — Encounter (HOSPITAL_BASED_OUTPATIENT_CLINIC_OR_DEPARTMENT_OTHER): Payer: Self-pay | Admitting: Cardiology

## 2023-11-06 ENCOUNTER — Ambulatory Visit (INDEPENDENT_AMBULATORY_CARE_PROVIDER_SITE_OTHER): Payer: Medicare HMO | Admitting: Cardiology

## 2023-11-06 VITALS — BP 100/58 | HR 47 | Ht 64.0 in | Wt 128.2 lb

## 2023-11-06 DIAGNOSIS — I050 Rheumatic mitral stenosis: Secondary | ICD-10-CM | POA: Diagnosis not present

## 2023-11-06 DIAGNOSIS — E7800 Pure hypercholesterolemia, unspecified: Secondary | ICD-10-CM

## 2023-11-06 DIAGNOSIS — I4719 Other supraventricular tachycardia: Secondary | ICD-10-CM

## 2023-11-06 DIAGNOSIS — I5032 Chronic diastolic (congestive) heart failure: Secondary | ICD-10-CM | POA: Diagnosis not present

## 2023-11-06 DIAGNOSIS — I38 Endocarditis, valve unspecified: Secondary | ICD-10-CM

## 2023-11-06 DIAGNOSIS — Z712 Person consulting for explanation of examination or test findings: Secondary | ICD-10-CM

## 2023-11-06 MED ORDER — METOPROLOL TARTRATE 25 MG PO TABS: 12.5000 mg | ORAL_TABLET | Freq: Two times a day (BID) | ORAL | 3 refills | Status: AC

## 2023-11-06 MED ORDER — SIMVASTATIN 10 MG PO TABS: 10.0000 mg | ORAL_TABLET | Freq: Every evening | ORAL | 3 refills | Status: AC

## 2023-11-06 NOTE — Patient Instructions (Addendum)
 Medication Instructions:  Your physician recommends that you continue on your current medications as directed. Please refer to the Current Medication list given to you today.   *If you need a refill on your cardiac medications before your next appointment, please call your pharmacy*  Follow-Up: At [facility], you and your health needs are our priority.  As part of our continuing mission to provide you with exceptional heart care, our providers are all part of one team.  This team includes your primary Cardiologist (physician) and Advanced Practice Providers or APPs (Physician Assistants and Nurse Practitioners) who all work together to provide you with the care you need, when you need it.  Please follow up in 6 months with Dr. Veryl Gottron, Slater Duncan, NP or Neomi Banks, NP   We recommend signing up for the patient portal called "MyChart".  Sign up information is provided on this After Visit Summary.  MyChart is used to connect with patients for Virtual Visits (Telemedicine).  Patients are able to view lab/test results, encounter notes, upcoming appointments, etc.  Non-urgent messages can be sent to your provider as well.   To learn more about what you can do with MyChart, go to [email or web address].   Other Instructions Consider exercise program as we discussed: 1) cardiac rehab 2) physical therapy (home or office based) 3) [employer] PRE[facility] program  Call/message me if you are interested in any of these.

## 2023-11-06 NOTE — Progress Notes (Signed)
 Cardiology Office Note:  .   Date:  11/06/2023  ID:  [name], [name] [birth year], MRN [id number] PCP: Sheryl Donna, PA-C  [facility] Providers Cardiologist:  Sheryle Donning, MD {  History of Present Illness: Tina [name]   CANSAS ELL is a 73 y.o. female with severe mitral stenosis here for follow up.  Pertinent CV history: I met her 03/2023 in the hospital, where she presented with acute hypoxic respiratory failure. Testing revealed severe mitral stenosis. She did not have a prior cardiac history. She underwent TTE, TEE, and R/LHC (see below) consistent with severe mitral stenosis. She was seen by Dr. Honey Lusty while admitted and deemed not a surgical candidate. Dr. Lorie Rook also reviewed her images for possible percutaneous intervention. She was referred to [facility] to meet with Dr. Minus Amel for a second opinion.  Today: Here with daughter in law today. Reviewed her evaluation with Dr. Minus Amel extensively. Most recent note from 06/27/23 noted that anatomy wasn't suitable for TMVR at this time, recommended cardiac rehab.  She is not active at all at home. Had home PT but didn't work hard with them. She walks with a rollator, more limited by back pain than shortness of breath. We discussed options at length, including cardiac rehab, PT, and [employer] programs.   Has 4 steps down from the house, walks to the car with her rollator. Walks around the house. Does not like to leave her house. Discussed making laps in her house, etc.   No resting symptoms, no palpitations. No edema. Weight has been stable, weighs every day. Hasn't required lasix .   We discussed management, symptoms at length today.  ROS: Denies chest pain, shortness of breath at rest. No PND, orthopnea, LE edema or unexpected weight gain. No syncope or palpitations. ROS otherwise negative except as noted.   Studies Reviewed: Tina [name]    EKG:       Physical Exam:   VS:  BP (!) 100/58   Pulse (!) 47   Ht 5\' 4"  (1.626 m)    Wt 128 lb 3.2 oz (58.2 kg)   SpO2 92%   BMI 22.01 kg/m    Wt Readings from Last 3 Encounters:  11/06/23 128 lb 3.2 oz (58.2 kg)  04/28/23 133 lb 9.6 oz (60.6 kg)  04/12/23 134 lb 11.2 oz (61.1 kg)    GEN: Well nourished, well developed in no acute distress HEENT: Normal, moist mucous membranes NECK: No JVD CARDIAC: regular rhythm with once ectopic beat, normal S1 and S2, no rubs or gallops. Did not appreciate diastolic rumble. VASCULAR: Radial and DP pulses 2+ bilaterally. No carotid bruits RESPIRATORY:  Clear to auscultation without rales, wheezing or rhonchi  ABDOMEN: Soft, non-tender, non-distended MUSCULOSKELETAL:  Ambulates independently SKIN: Warm and dry, no edema NEUROLOGIC:  Alert and oriented x 3. No focal neuro deficits noted. PSYCHIATRIC:  Normal affect    ASSESSMENT AND PLAN: .    Severe mitral stenosis -Echo personally reviewed: EF normal, moderate LV, RV function mildly reduced. Moderate to severe mitral stenosis with mean gradient 9 mmHg at 84 bpm. Severe MAC. MVA 0.47 by continuity equation. PHT 157, DT 543, MVA 1.4 by PHT/DT. Leaflets/subvalvular apparatus not well visualized but appear at least moderately thickened. LA size underestimated on echo measurements -wilkins score not favorable for balloon valvuloplasty -TEE with heavily calcified mitral annulus. Posterior leaflet calcified and restricted. Severe stenosis by MVA.  -Cath shows nonobstructive CAD, severe mitral stenosis, low filling pressures, moderate pulmonary hypertension, normal output/index.  -Evaluated by  Dr. Honey Lusty, deemed not a surgical candidate. -Dr. Minus Amel at [facility] evaluated, LVOT too small for current TMVR, recommended cardiac rehab -clinically has been stable. Minimally active. Discussed recommendations for this, she will consider -for now, given her stability, she would like to avoid intervention. Reviewed symptoms that would suggest a change in her clinical trajectory -reviewed reason for  metoprolol , goal for heart rate -reviewed red flag warning signs that need immediate medical attention   MAT/PACs -No definitive evidence of atrial fibrillation thus far. If this does occur, with mitral stenosis, this would be considered valvular atrial fibrillation, therefore warfarin is preferred over DOAC. But there is no indication for anticoagulation at this time.  -aim for rate control with severe mitral stenosis   Hypertension: was only on metoprolol  as an outpatient. Using this for rate control, as above  Hyperlipidemia: continue simvastatin   Prior tobacco use: quit December 2023  CV risk counseling and prevention -recommend heart healthy/Mediterranean diet, with whole grains, fruits, vegetable, fish, lean meats, nuts, and olive oil. Limit salt. -recommend moderate walking, 3-5 times/week for 30-50 minutes each session. Aim for at least 150 minutes.week. Goal should be pace of 3 miles/hours, or walking 1.5 miles in 30 minutes -recommend avoidance of tobacco products. Avoid excess alcohol.  Dispo: 6 mos or sooner, per patient preference (she prefers to wait for 6mos and call with change in symptoms if sooner).  Total time of encounter: I spent 42 minutes dedicated to the care of this patient on the date of this encounter to include pre-visit review of records, face-to-face time with the patient discussing conditions above, and clinical documentation with the electronic health record. We specifically spent time today discussing her [facility] evaluation for mitral stenosis, activity recommendations, signs/symptoms to watch for   Signed, Sheryle Donning, MD   Sheryle Donning, MD, PhD, [facility]  [facility]  [place]  [facility] [address], [address] [place], [place] [address] [phone]

## 2023-11-12 ENCOUNTER — Encounter (HOSPITAL_BASED_OUTPATIENT_CLINIC_OR_DEPARTMENT_OTHER): Payer: Self-pay

## 2024-01-09 DEATH — deceased
